# Patient Record
Sex: Male | Born: 1970
Health system: Southern US, Community
[De-identification: ages and names within clinical notes are randomized; demographics above are authoritative.]

## PROBLEM LIST (undated history)

## (undated) DIAGNOSIS — I10 Essential (primary) hypertension: Secondary | ICD-10-CM

## (undated) DIAGNOSIS — E7800 Pure hypercholesterolemia, unspecified: Secondary | ICD-10-CM

## (undated) DIAGNOSIS — E876 Hypokalemia: Secondary | ICD-10-CM

---

## 2003-08-14 ENCOUNTER — Emergency Department (HOSPITAL_COMMUNITY): Admission: EM | Admit: 2003-08-14 | Discharge: 2003-08-14 | Payer: Self-pay | Admitting: Emergency Medicine

## 2008-09-25 ENCOUNTER — Emergency Department (HOSPITAL_BASED_OUTPATIENT_CLINIC_OR_DEPARTMENT_OTHER): Admission: EM | Admit: 2008-09-25 | Discharge: 2008-09-25 | Payer: Self-pay | Admitting: Emergency Medicine

## 2009-04-16 ENCOUNTER — Ambulatory Visit: Payer: Self-pay | Admitting: Diagnostic Radiology

## 2009-04-16 ENCOUNTER — Emergency Department (HOSPITAL_BASED_OUTPATIENT_CLINIC_OR_DEPARTMENT_OTHER): Admission: EM | Admit: 2009-04-16 | Discharge: 2009-04-16 | Payer: Self-pay | Admitting: Emergency Medicine

## 2009-05-23 ENCOUNTER — Emergency Department (HOSPITAL_BASED_OUTPATIENT_CLINIC_OR_DEPARTMENT_OTHER): Admission: EM | Admit: 2009-05-23 | Discharge: 2009-05-23 | Payer: Self-pay | Admitting: Emergency Medicine

## 2010-06-03 LAB — POCT TOXICOLOGY PANEL

## 2010-06-03 LAB — BASIC METABOLIC PANEL
BUN: 10 mg/dL (ref 6–23)
CO2: 28 mEq/L (ref 19–32)
Calcium: 9 mg/dL (ref 8.4–10.5)
GFR calc non Af Amer: >60 mL/min (ref >60)

## 2010-06-03 LAB — POCT CARDIAC MARKERS
CKMB, poc: <1 ng/mL — ABNORMAL LOW (ref 1.0–8.0)
Myoglobin, poc: 61.9 ng/mL (ref 12–200)

## 2010-06-03 LAB — URINALYSIS, ROUTINE W REFLEX MICROSCOPIC: Hgb urine dipstick: NEGATIVE

## 2010-06-16 LAB — COMPREHENSIVE METABOLIC PANEL
AST: 54 U/L — ABNORMAL HIGH (ref 0–37)
BUN: 20 mg/dL (ref 6–23)
Chloride: 100 mEq/L (ref 96–112)
GFR calc Af Amer: >60 mL/min (ref >60)
Glucose, Bld: 153 mg/dL — ABNORMAL HIGH (ref 70–99)
Potassium: 3.3 mEq/L — ABNORMAL LOW (ref 3.5–5.1)

## 2010-06-16 LAB — DIFFERENTIAL
Eosinophils Relative: 0 % (ref 0–5)
Lymphs Abs: 1.9 10*3/uL (ref 0.7–4.0)
Monocytes Absolute: 0.8 10*3/uL (ref 0.1–1.0)
Monocytes Relative: 6 % (ref 3–12)
Neutro Abs: 9.7 10*3/uL — ABNORMAL HIGH (ref 1.7–7.7)
Neutrophils Relative %: 77 % (ref 43–77)

## 2010-06-16 LAB — URINALYSIS, ROUTINE W REFLEX MICROSCOPIC
Bilirubin Urine: NEGATIVE
Nitrite: NEGATIVE
Protein, ur: NEGATIVE mg/dL
Specific Gravity, Urine: 1.026 (ref 1.005–1.030)
pH: 5.5 (ref 5.0–8.0)

## 2010-06-16 LAB — CBC
Hemoglobin: 16.3 g/dL (ref 13.0–17.0)
Platelets: 209 10*3/uL (ref 150–400)
RDW: 11.8 % (ref 11.5–15.5)

## 2010-06-16 LAB — POCT CARDIAC MARKERS
CKMB, poc: 5.9 ng/mL (ref 1.0–8.0)
Troponin i, poc: <0.05 ng/mL (ref 0.00–0.09)

## 2010-06-16 LAB — GLUCOSE, CAPILLARY: Glucose-Capillary: 122 mg/dL — ABNORMAL HIGH (ref 70–99)

## 2010-10-13 ENCOUNTER — Emergency Department (HOSPITAL_BASED_OUTPATIENT_CLINIC_OR_DEPARTMENT_OTHER)
Admission: EM | Admit: 2010-10-13 | Discharge: 2010-10-14 | Disposition: A | Discharge status: Home / Self Care | Payer: Self-pay | Attending: Emergency Medicine | Admitting: Emergency Medicine

## 2010-10-13 ENCOUNTER — Other Ambulatory Visit: Payer: Self-pay

## 2010-10-13 ENCOUNTER — Emergency Department (INDEPENDENT_AMBULATORY_CARE_PROVIDER_SITE_OTHER): Payer: Self-pay

## 2010-10-13 ENCOUNTER — Encounter: Payer: Self-pay | Admitting: Emergency Medicine

## 2010-10-13 DIAGNOSIS — E876 Hypokalemia: Secondary | ICD-10-CM | POA: Insufficient documentation

## 2010-10-13 DIAGNOSIS — R42 Dizziness and giddiness: Secondary | ICD-10-CM

## 2010-10-13 DIAGNOSIS — I10 Essential (primary) hypertension: Secondary | ICD-10-CM | POA: Insufficient documentation

## 2010-10-13 DIAGNOSIS — R059 Cough, unspecified: Secondary | ICD-10-CM

## 2010-10-13 DIAGNOSIS — F17200 Nicotine dependence, unspecified, uncomplicated: Secondary | ICD-10-CM | POA: Insufficient documentation

## 2010-10-13 DIAGNOSIS — R05 Cough: Secondary | ICD-10-CM

## 2010-10-13 HISTORY — DX: Essential (primary) hypertension: I10

## 2010-10-13 LAB — BASIC METABOLIC PANEL
BUN: 11 mg/dL (ref 6–23)
CO2: 26 mEq/L (ref 19–32)
Calcium: 9.3 mg/dL (ref 8.4–10.5)
Chloride: 101 mEq/L (ref 96–112)
Creatinine, Ser: 0.8 mg/dL (ref 0.50–1.35)
Glucose, Bld: 113 mg/dL — ABNORMAL HIGH (ref 70–99)
Potassium: 3 mEq/L — ABNORMAL LOW (ref 3.5–5.1)
Sodium: 139 mEq/L (ref 135–145)

## 2010-10-13 LAB — CARDIAC PANEL(CRET KIN+CKTOT+MB+TROPI)
CK, MB: 5.1 ng/mL — ABNORMAL HIGH (ref 0.3–4.0)
Relative Index: 0.8 (ref 0.0–2.5)
Total CK: 646 U/L — ABNORMAL HIGH (ref 7–232)
Troponin I: <0.3 ng/mL

## 2010-10-13 LAB — CBC
MCH: 29.7 pg (ref 26.0–34.0)
MCV: 83.5 fL (ref 78.0–100.0)
RBC: 4.98 MIL/uL (ref 4.22–5.81)

## 2010-10-13 MED ORDER — POTASSIUM CHLORIDE CRYS ER 20 MEQ PO TBCR
40.0000 meq | EXTENDED_RELEASE_TABLET | Freq: Once | ORAL | Status: AC
  Administered 2010-10-14: 40 meq via ORAL
  Filled 2010-10-13: qty 2

## 2010-10-13 MED ORDER — SODIUM CHLORIDE 0.9 % IV BOLUS (SEPSIS)
1000.0000 mL | Freq: Once | INTRAVENOUS | Status: AC
  Administered 2010-10-14: 1000 mL via INTRAVENOUS

## 2010-10-13 NOTE — ED Provider Notes (Signed)
History     CSN: [id number] Arrival date & time: 10/13/2010  9:49 PM  Chief Complaint  Patient presents with  . Dizziness    Pt CO blurred vision and dizziness today x 24hrs reports "feel dehydrated   HPI Comments: Pt c/o dizziness and vision being hard to focus that started 2 days ago after working out it the heat for several hours.Pt states that he is not having pain  Anywhere but the symptoms area continuing:pt state that he has a history of potasium being low when this happens:pt states that he has been trying to drink gatorade and eat bananas without the symptoms resolving:pt states that he was in the shower tonight and the symptoms seemed worse and that is what made him come in tonight.Pt denies fever, n/v/d.  The history is provided by the patient. No language interpreter was used.    Past Medical History  Diagnosis Date  . Hypertension     History reviewed. No pertinent past surgical history.  History reviewed. No pertinent family history.  History  Substance Use Topics  . Smoking status: Current Some Day Smoker -- 1.0 packs/day  . Smokeless tobacco: Not on file  . Alcohol Use: No      Review of Systems  All other systems reviewed and are negative.    Physical Exam  BP 120/80  Pulse 80  Temp(Src) 99.1 F (37.3 C) (Oral)  Resp 22  SpO2 100%  Physical Exam  Constitutional: He is oriented to person, place, and time. He appears well-developed and well-nourished.  HENT:  Head: Normocephalic.  Eyes: Pupils are equal, round, and reactive to light.  Neck: Normal range of motion.  Cardiovascular: Normal rate and regular rhythm.   Pulmonary/Chest: Effort normal and breath sounds normal.  Abdominal: Soft. Bowel sounds are normal.  Musculoskeletal: Normal range of motion.  Neurological: He is alert and oriented to person, place, and time. Coordination normal.  Skin: Skin is warm and dry.  Psychiatric: He has a normal mood and affect.    ED Course   Procedures Results for orders placed during the hospital encounter of 10/13/10  CBC      Component Value Range   WBC 4.9  4.0 - 10.5 (K/uL)   RBC 4.98  4.22 - 5.81 (MIL/uL)   Hemoglobin 14.8  13.0 - 17.0 (g/dL)   HCT 30.8  65.7 - 84.6 (%)   MCV 83.5  78.0 - 100.0 (fL)   MCH 29.7  26.0 - 34.0 (pg)   MCHC 35.6  30.0 - 36.0 (g/dL)   RDW 96.2  95.2 - 84.1 (%)   Platelets 170  150 - 400 (K/uL)  BASIC METABOLIC PANEL      Component Value Range   Sodium 139  135 - 145 (mEq/L)   Potassium 3.0 (*) 3.5 - 5.1 (mEq/L)   Chloride 101  96 - 112 (mEq/L)   CO2 26  19 - 32 (mEq/L)   Glucose, Bld 113 (*) 70 - 99 (mg/dL)   BUN 11  6 - 23 (mg/dL)   Creatinine, Ser 3.24  0.50 - 1.35 (mg/dL)   Calcium 9.3  8.4 - 40.1 (mg/dL)   GFR calc non Af Amer >60  >60 (mL/min)   GFR calc Af Amer >60  >60 (mL/min)  CARDIAC PANEL(CRET KIN+CKTOT+MB+TROPI)      Component Value Range   Total CK 646 (*) 7 - 232 (U/L)   CK, MB 5.1 (*) 0.3 - 4.0 (ng/mL)   Troponin I <0.30  <0.30 (  ng/mL)   Relative Index 0.8  0.0 - 2.5    Dg Chest 2 View  10/13/2010  *RADIOLOGY REPORT*  Clinical Data: Dizziness and cough.  CHEST - 2 VIEW  Comparison: Chest x-ray 08/14/2003.  Findings: The cardiac silhouette, mediastinal and hilar contours are within normal limits and stable.  The lungs are clear.  The bony thorax is intact.  IMPRESSION: No acute cardiopulmonary findings.  Original Report Authenticated By: P. Loralie Champagne, M.D.    Date: 10/13/2010  Rate: 66  Rhythm: normal sinus rhythm  QRS Axis: normal  Intervals: normal  ST/T Wave abnormalities: normal  Conduction Disutrbances:first-degree A-V block   Narrative Interpretation:   Old EKG Reviewed: unchanged 12:02 AM Pt still slightly symptomatic:will give pt some more fluids MDM Likely related to dehydration:pt given hydration and is feeling better at this time      Teressa Lower, NP 10/14/10 0050

## 2010-10-13 NOTE — ED Notes (Signed)
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

## 2010-10-14 MED ORDER — SODIUM CHLORIDE 0.9 % IV BOLUS (SEPSIS)
1000.0000 mL | Freq: Once | INTRAVENOUS | Status: AC
Start: 2010-10-14 — End: 2010-10-14
  Administered 2010-10-14: 1000 mL via INTRAVENOUS

## 2010-10-17 NOTE — ED Provider Notes (Signed)
Medical screening examination/treatment/procedure(s) were performed by non-physician practitioner and as supervising physician I was immediately available for consultation/collaboration.  Juliet Rude. Rubin Payor, MD 10/17/10 [id number]

## 2011-04-21 ENCOUNTER — Encounter (HOSPITAL_BASED_OUTPATIENT_CLINIC_OR_DEPARTMENT_OTHER): Payer: Self-pay | Admitting: Family Medicine

## 2011-04-21 ENCOUNTER — Emergency Department (HOSPITAL_BASED_OUTPATIENT_CLINIC_OR_DEPARTMENT_OTHER)
Admission: EM | Admit: 2011-04-21 | Discharge: 2011-04-21 | Disposition: A | Discharge status: Home / Self Care | Payer: Self-pay | Attending: Emergency Medicine | Admitting: Emergency Medicine

## 2011-04-21 DIAGNOSIS — E7800 Pure hypercholesterolemia, unspecified: Secondary | ICD-10-CM | POA: Insufficient documentation

## 2011-04-21 DIAGNOSIS — B349 Viral infection, unspecified: Secondary | ICD-10-CM

## 2011-04-21 DIAGNOSIS — IMO0001 Reserved for inherently not codable concepts without codable children: Secondary | ICD-10-CM | POA: Insufficient documentation

## 2011-04-21 DIAGNOSIS — Z79899 Other long term (current) drug therapy: Secondary | ICD-10-CM | POA: Insufficient documentation

## 2011-04-21 DIAGNOSIS — B9789 Other viral agents as the cause of diseases classified elsewhere: Secondary | ICD-10-CM | POA: Insufficient documentation

## 2011-04-21 DIAGNOSIS — F17200 Nicotine dependence, unspecified, uncomplicated: Secondary | ICD-10-CM | POA: Insufficient documentation

## 2011-04-21 DIAGNOSIS — J3489 Other specified disorders of nose and nasal sinuses: Secondary | ICD-10-CM | POA: Insufficient documentation

## 2011-04-21 DIAGNOSIS — I10 Essential (primary) hypertension: Secondary | ICD-10-CM | POA: Insufficient documentation

## 2011-04-21 HISTORY — DX: Pure hypercholesterolemia, unspecified: E78.00

## 2011-04-21 LAB — URINALYSIS, ROUTINE W REFLEX MICROSCOPIC
Glucose, UA: NEGATIVE mg/dL
Hgb urine dipstick: NEGATIVE
Leukocytes, UA: NEGATIVE
Specific Gravity, Urine: 1.034 — ABNORMAL HIGH (ref 1.005–1.030)
pH: 5.5 (ref 5.0–8.0)

## 2011-04-21 MED ORDER — OSELTAMIVIR PHOSPHATE 75 MG PO CAPS: 75.0000 mg | ORAL_CAPSULE | Freq: Two times a day (BID) | ORAL | Status: AC

## 2011-04-21 NOTE — ED Provider Notes (Signed)
Medical screening examination/treatment/procedure(s) were performed by non-physician practitioner and as supervising physician I was immediately available for consultation/collaboration.   Hurman Horn, MD 04/21/11 [phone]

## 2011-04-21 NOTE — ED Notes (Signed)
Pt c/o congestion, back ache and "itchy" throat. Pt sts he took tylenol today.

## 2011-04-21 NOTE — ED Provider Notes (Signed)
History     CSN: [id number]  Arrival date & time 04/21/11  1736   First MD Initiated Contact with Patient 04/21/11 1825      Chief Complaint  Patient presents with  . Generalized Body Aches  . Nasal Congestion    (Consider location/radiation/quality/duration/timing/severity/associated sxs/prior treatment) HPI Comments: Pt state that he started with myalgias, sore throat and nasal congestion 2 days ago:pt states that he has been running fever intermittently:pt state that he feels like he has something in his throat  The history is provided by the patient. No language interpreter was used.    Past Medical History  Diagnosis Date  . Hypertension   . High cholesterol     History reviewed. No pertinent past surgical history.  No family history on file.  History  Substance Use Topics  . Smoking status: Current Some Day Smoker -- 1.0 packs/day    Types: Cigars  . Smokeless tobacco: Not on file  . Alcohol Use: No      Review of Systems  All other systems reviewed and are negative.    Allergies  Review of patient's allergies indicates no known allergies.  Home Medications   Current Outpatient Rx  Name Route Sig Dispense Refill  . ACETAMINOPHEN 500 MG PO TABS Oral Take 1,000 mg by mouth once.    Marland Kitchen CALCIUM GUMMIES PO Oral Take 3 tablets by mouth daily.      Marland Kitchen DM-PHENYLEPHRINE-ACETAMINOPHEN 10-5-325 MG/15ML PO LIQD Oral Take 30 mLs by mouth once.      . IBUPROFEN 200 MG PO TABS Oral Take 400 mg by mouth as needed. Pain     . OLMESARTAN MEDOXOMIL-HCTZ 40-25 MG PO TABS Oral Take 1 tablet by mouth daily.      Marland Kitchen PSEUDOEPHEDRINE-APAP-DM 60-1000-30 MG/30ML PO LIQD Oral Take 30 mLs by mouth every 4 (four) hours as needed. For congestion    . CRESTOR PO Oral Take 1 tablet by mouth daily.        BP 158/109  Pulse 67  Temp(Src) 98.2 F (36.8 C) (Oral)  Resp 18  Ht 6' (1.829 m)  Wt 225 lb (102.059 kg)  BMI 30.52 kg/m2  SpO2 100%  Physical Exam  Nursing note and  vitals reviewed. Constitutional: He is oriented to person, place, and time. He appears well-developed and well-nourished.  HENT:  Head: Normocephalic and atraumatic.  Right Ear: External ear normal.  Left Ear: External ear normal.  Mouth/Throat: Posterior oropharyngeal erythema present.  Eyes: EOM are normal.  Neck: Neck supple.  Cardiovascular: Normal rate and regular rhythm.   Pulmonary/Chest: Effort normal and breath sounds normal.  Abdominal: Soft. Bowel sounds are normal.  Musculoskeletal: Normal range of motion.  Neurological: He is alert and oriented to person, place, and time.  Skin: Skin is warm.    ED Course  Procedures (including critical care time)  Labs Reviewed  URINALYSIS, ROUTINE W REFLEX MICROSCOPIC - Abnormal; Notable for the following:    Specific Gravity, Urine 1.034 (*)    All other components within normal limits  RAPID STREP SCREEN   No results found.   1. Viral illness       MDM  Pt states that he thinks he was exposed to the flu and he feels like he needs to be treated:discussed risk benefit with pt       Teressa Lower, NP 04/21/11 2234

## 2011-08-07 IMAGING — CT CT PARANASAL SINUSES LIMITED
1 series · 16 of 30 positions shown, 20 images · non-contrast
Comparison: None.
COMPARISON: None.

CLINICAL DATA: sinus infection.

CT LIMITED SINUSES WITHOUT CONTRAST
TECHNIQUE: Multidetector CT images of the paranasal sinuses were
obtained in a single plane without contrast.
TECHNIQUE: Contiguous axial images were obtained from the base of
the skull through the vertex without intravenous contrast.

[Series 2: head 4.8 h37s · axial · 0.48mm/px · z∈[+1246,+1398]mm · 16 of 36 slices shown, 20 images]
[im 2/36  brain]
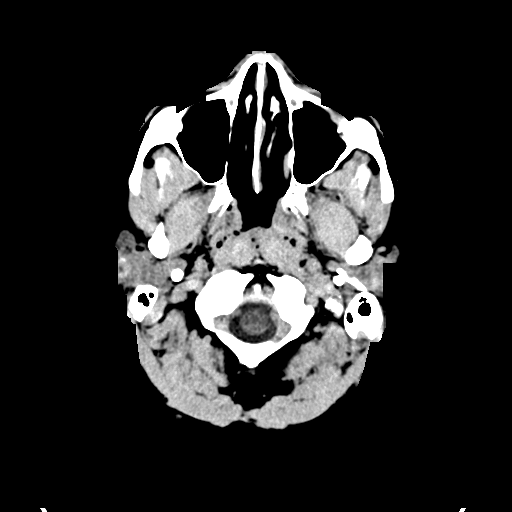
[im 2/36  bone]
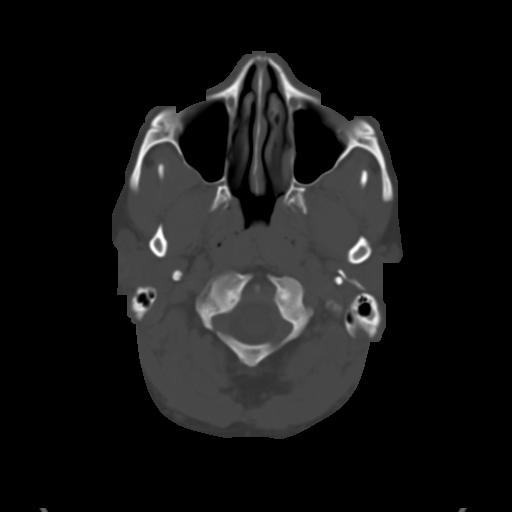
[im 4/36  bone]
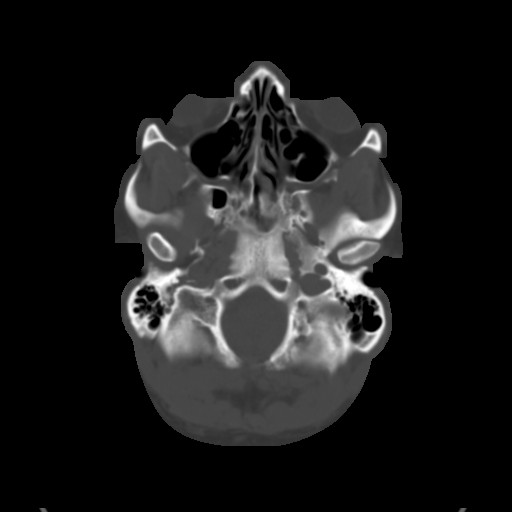
[im 7/36  bone]
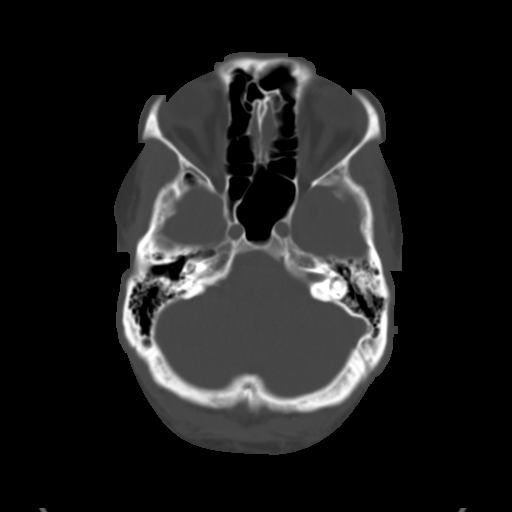
[im 9/36  bone]
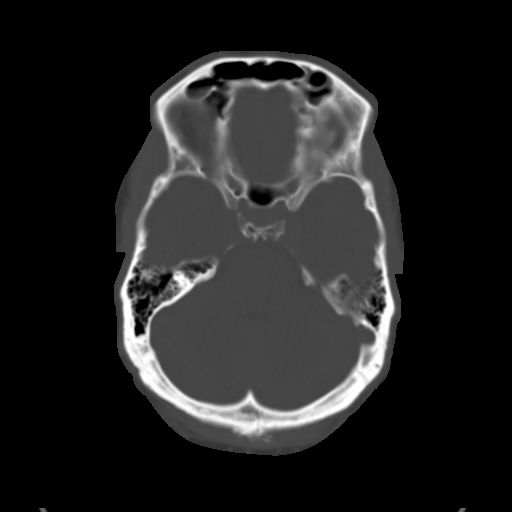
[im 10/36  brain]
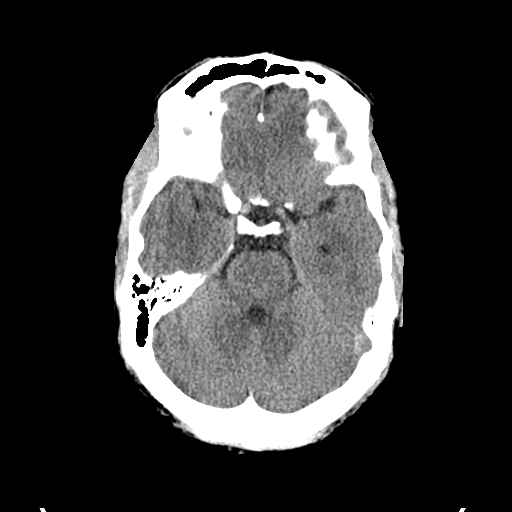
[im 10/36  bone]
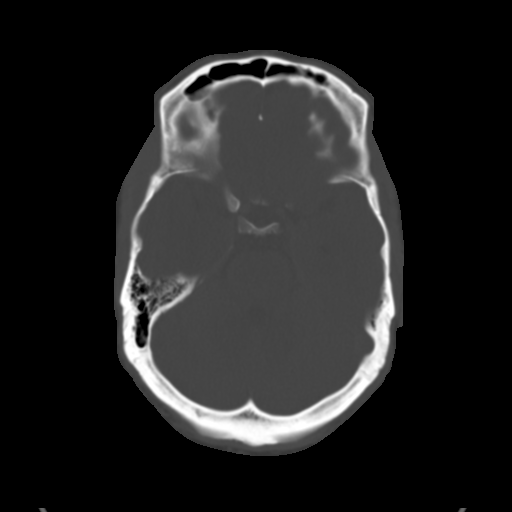
[im 13/36  bone]
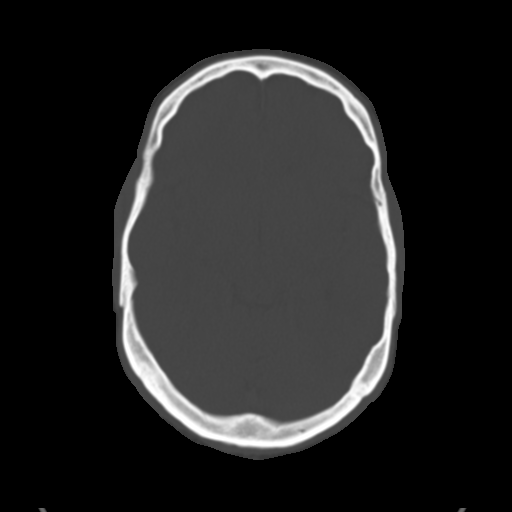
[im 15/36  bone]
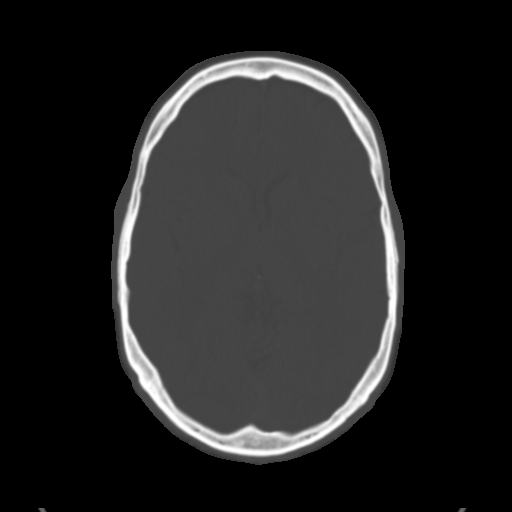
[im 17/36  bone]
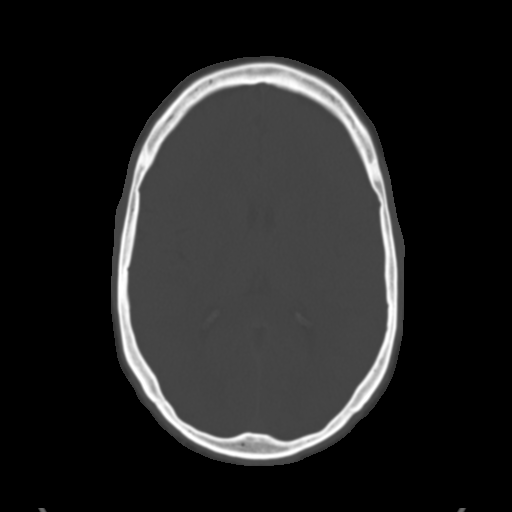
[im 19/36  brain]
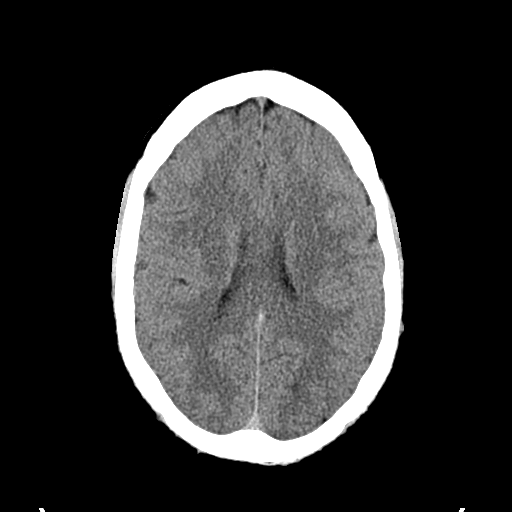
[im 19/36  bone]
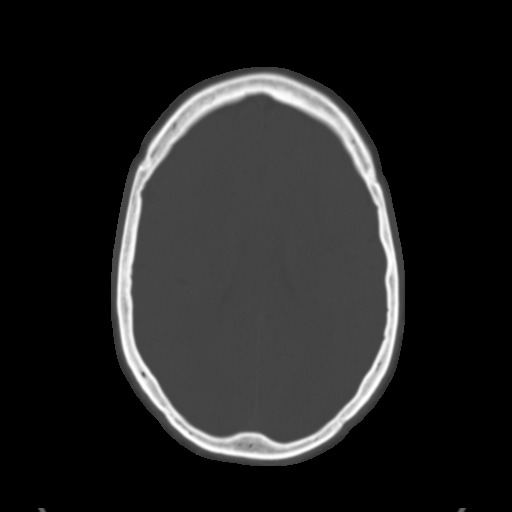
[im 21/36  bone]
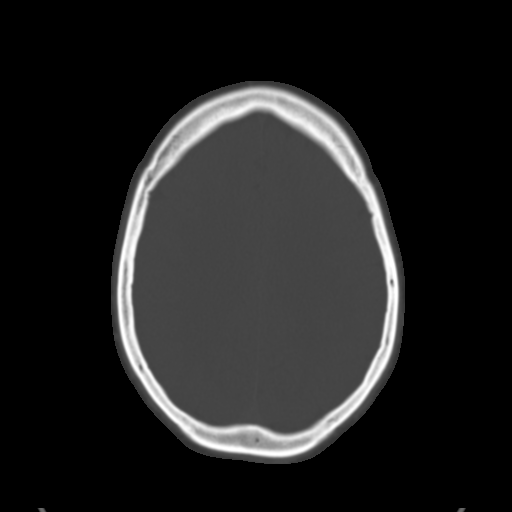
[im 23/36  bone]
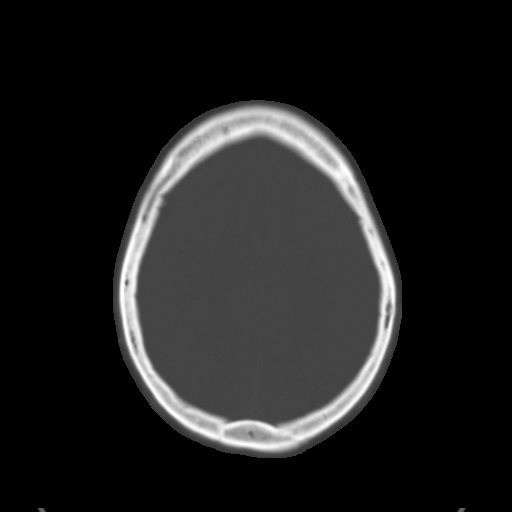
[im 26/36  bone]
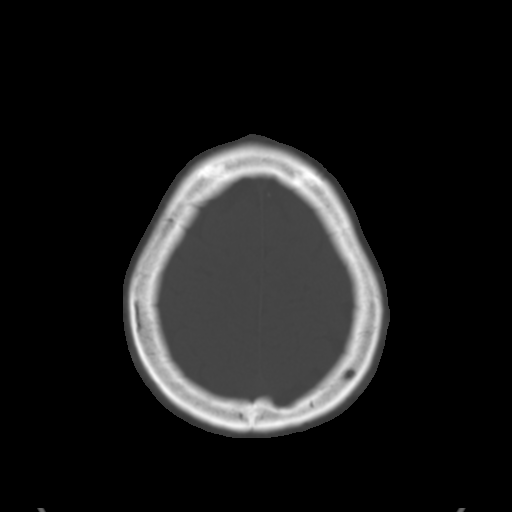
[im 27/36  brain]
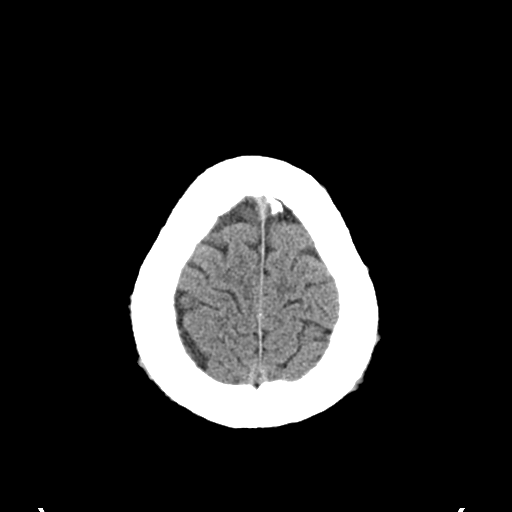
[im 27/36  bone]
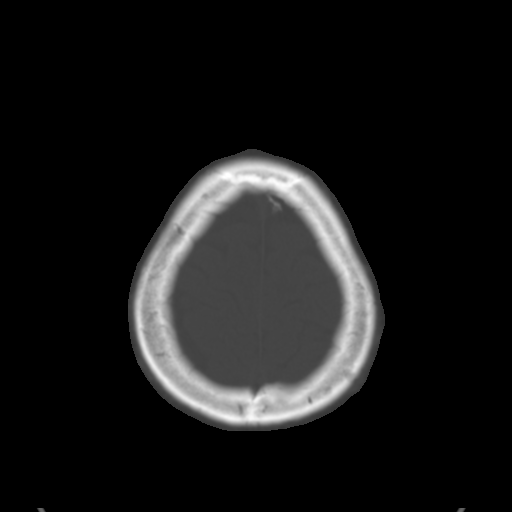
[im 29/36  bone]
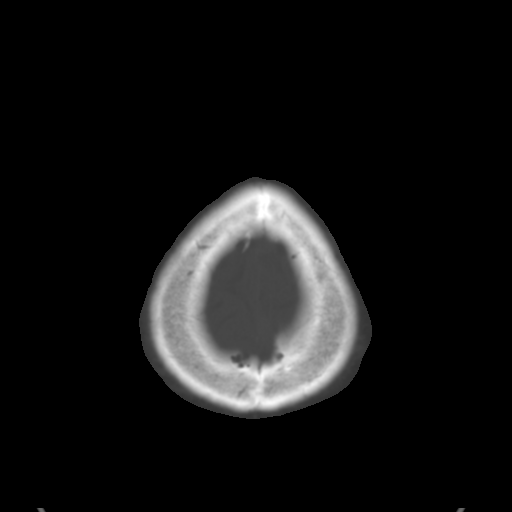
[im 32/36  bone]
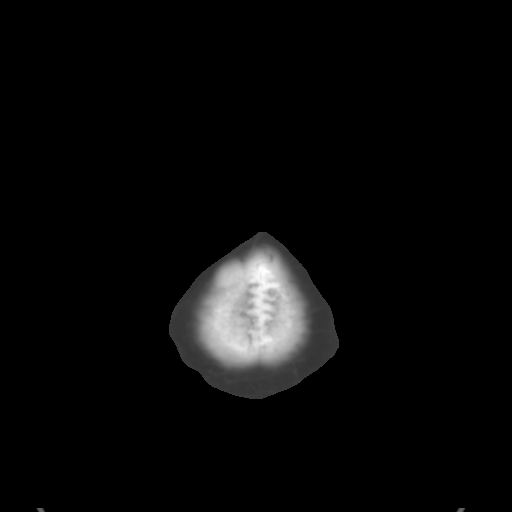
[im 34/36  bone]
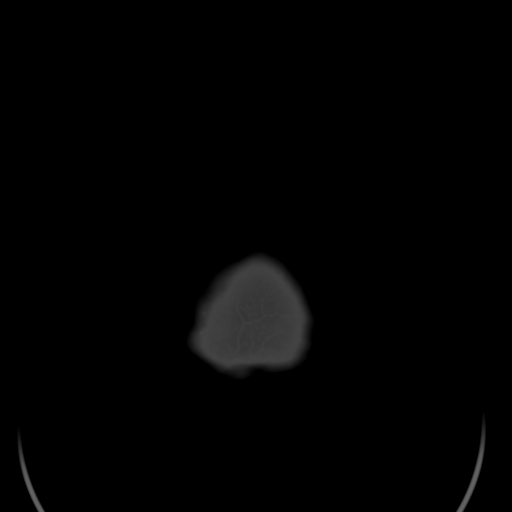

[16 of 30 positions shown; findings below may reference images not displayed]

FINDINGS: Limited evaluation of the sinuses were performed.  The
paranasal sinuses are well aerated and developed.  There is no
mucosal thickening.  Negative for air fluid level or retention
cyst.  There is no fracture or bony mass lesion.
IMPRESSION: Normal limited CT of the paranasal sinuses.

CT HEAD WITHOUT CONTRAST
FINDINGS: The brain has a normal appearance without evidence for
hemorrhage, infarction, hydrocephalus, or mass lesion.  There is no
extra axial fluid collection.  The skull and paranasal sinuses are
normal.
IMPRESSION: No acute intracranial abnormalities.

## 2013-04-03 ENCOUNTER — Emergency Department (HOSPITAL_BASED_OUTPATIENT_CLINIC_OR_DEPARTMENT_OTHER): Payer: Self-pay

## 2013-04-03 ENCOUNTER — Emergency Department (HOSPITAL_BASED_OUTPATIENT_CLINIC_OR_DEPARTMENT_OTHER)
Admission: EM | Admit: 2013-04-03 | Discharge: 2013-04-03 | Disposition: A | Discharge status: Home / Self Care | Payer: Self-pay | Attending: Emergency Medicine | Admitting: Emergency Medicine

## 2013-04-03 ENCOUNTER — Encounter (HOSPITAL_BASED_OUTPATIENT_CLINIC_OR_DEPARTMENT_OTHER): Payer: Self-pay | Admitting: Emergency Medicine

## 2013-04-03 DIAGNOSIS — R42 Dizziness and giddiness: Secondary | ICD-10-CM | POA: Insufficient documentation

## 2013-04-03 DIAGNOSIS — S060X9A Concussion with loss of consciousness of unspecified duration, initial encounter: Secondary | ICD-10-CM

## 2013-04-03 DIAGNOSIS — S060X0A Concussion without loss of consciousness, initial encounter: Secondary | ICD-10-CM | POA: Insufficient documentation

## 2013-04-03 DIAGNOSIS — Z23 Encounter for immunization: Secondary | ICD-10-CM | POA: Insufficient documentation

## 2013-04-03 DIAGNOSIS — R1110 Vomiting, unspecified: Secondary | ICD-10-CM | POA: Insufficient documentation

## 2013-04-03 DIAGNOSIS — S0181XA Laceration without foreign body of other part of head, initial encounter: Secondary | ICD-10-CM

## 2013-04-03 DIAGNOSIS — E7800 Pure hypercholesterolemia, unspecified: Secondary | ICD-10-CM | POA: Insufficient documentation

## 2013-04-03 DIAGNOSIS — S01501A Unspecified open wound of lip, initial encounter: Secondary | ICD-10-CM | POA: Insufficient documentation

## 2013-04-03 DIAGNOSIS — R5381 Other malaise: Secondary | ICD-10-CM | POA: Insufficient documentation

## 2013-04-03 DIAGNOSIS — R5383 Other fatigue: Secondary | ICD-10-CM

## 2013-04-03 DIAGNOSIS — S0510XA Contusion of eyeball and orbital tissues, unspecified eye, initial encounter: Secondary | ICD-10-CM | POA: Insufficient documentation

## 2013-04-03 DIAGNOSIS — I10 Essential (primary) hypertension: Secondary | ICD-10-CM | POA: Insufficient documentation

## 2013-04-03 DIAGNOSIS — S060XAA Concussion with loss of consciousness status unknown, initial encounter: Secondary | ICD-10-CM

## 2013-04-03 DIAGNOSIS — F17200 Nicotine dependence, unspecified, uncomplicated: Secondary | ICD-10-CM | POA: Insufficient documentation

## 2013-04-03 DIAGNOSIS — S0990XA Unspecified injury of head, initial encounter: Secondary | ICD-10-CM

## 2013-04-03 DIAGNOSIS — Z79899 Other long term (current) drug therapy: Secondary | ICD-10-CM | POA: Insufficient documentation

## 2013-04-03 LAB — CBC WITH DIFFERENTIAL/PLATELET
BASOS ABS: 0 10*3/uL (ref 0.0–0.1)
Basophils Relative: 0 % (ref 0–1)
EOS PCT: 0 % (ref 0–5)
Eosinophils Absolute: 0 10*3/uL (ref 0.0–0.7)
HEMATOCRIT: 44.4 % (ref 39.0–52.0)
HEMOGLOBIN: 15.6 g/dL (ref 13.0–17.0)
Lymphocytes Relative: 17 % (ref 12–46)
Lymphs Abs: 2.4 10*3/uL (ref 0.7–4.0)
MCH: 29.8 pg (ref 26.0–34.0)
MCHC: 35.1 g/dL (ref 30.0–36.0)
MCV: 84.7 fL (ref 78.0–100.0)
MONO ABS: 1.1 10*3/uL — AB (ref 0.1–1.0)
MONOS PCT: 8 % (ref 3–12)
NEUTROS ABS: 10.5 10*3/uL — AB (ref 1.7–7.7)
Neutrophils Relative %: 75 % (ref 43–77)
Platelets: 272 10*3/uL (ref 150–400)
RBC: 5.24 MIL/uL (ref 4.22–5.81)
RDW: 11.9 % (ref 11.5–15.5)
WBC: 14 10*3/uL — ABNORMAL HIGH (ref 4.0–10.5)

## 2013-04-03 LAB — BASIC METABOLIC PANEL
BUN: 16 mg/dL (ref 6–23)
CALCIUM: 10.7 mg/dL — AB (ref 8.4–10.5)
CHLORIDE: 99 meq/L (ref 96–112)
CO2: 25 mEq/L (ref 19–32)
CREATININE: 1.2 mg/dL (ref 0.50–1.35)
GFR calc Af Amer: 84 mL/min — ABNORMAL LOW (ref >90)
GFR calc non Af Amer: 73 mL/min — ABNORMAL LOW (ref >90)
Glucose, Bld: 119 mg/dL — ABNORMAL HIGH (ref 70–99)
Potassium: 3.6 mEq/L — ABNORMAL LOW (ref 3.7–5.3)
Sodium: 142 mEq/L (ref 137–147)

## 2013-04-03 MED ORDER — NAPROXEN 500 MG PO TABS: 500.0000 mg | ORAL_TABLET | Freq: Two times a day (BID) | ORAL | Status: AC

## 2013-04-03 MED ORDER — HYDROMORPHONE HCL PF 1 MG/ML IJ SOLN
0.5000 mg | Freq: Once | INTRAMUSCULAR | Status: AC
  Administered 2013-04-03: 0.5 mg via INTRAVENOUS
  Filled 2013-04-03: qty 1

## 2013-04-03 MED ORDER — PROMETHAZINE HCL 25 MG PO TABS: 25.0000 mg | ORAL_TABLET | Freq: Four times a day (QID) | ORAL | Status: AC | PRN

## 2013-04-03 MED ORDER — ONDANSETRON HCL 4 MG/2ML IJ SOLN
4.0000 mg | Freq: Once | INTRAMUSCULAR | Status: AC
  Administered 2013-04-03: 4 mg via INTRAVENOUS
  Filled 2013-04-03: qty 2

## 2013-04-03 MED ORDER — SODIUM CHLORIDE 0.9 % IV SOLN
INTRAVENOUS | Status: DC
  Administered 2013-04-03: 20:00:00 via INTRAVENOUS

## 2013-04-03 MED ORDER — TETANUS-DIPHTH-ACELL PERTUSSIS 5-2.5-18.5 LF-MCG/0.5 IM SUSP
0.5000 mL | Freq: Once | INTRAMUSCULAR | Status: AC
  Administered 2013-04-03: 0.5 mL via INTRAMUSCULAR
  Filled 2013-04-03: qty 0.5

## 2013-04-03 MED ORDER — HYDROCODONE-ACETAMINOPHEN 5-325 MG PO TABS: 1.0000 | ORAL_TABLET | Freq: Four times a day (QID) | ORAL | Status: AC | PRN

## 2013-04-03 MED ORDER — SODIUM CHLORIDE 0.9 % IV BOLUS (SEPSIS)
250.0000 mL | Freq: Once | INTRAVENOUS | Status: AC
  Administered 2013-04-03: 250 mL via INTRAVENOUS

## 2013-04-03 NOTE — Discharge Instructions (Signed)
Concussion, Adult A concussion is a brain injury. It is caused by:  A hit to the head.  A quick and sudden movement (jolt) of the head or neck. A concussion is usually not life-threatening. Even so, it can cause serious problems. If you had a concussion before, you may have concussion-like problems after a hit to your head. HOME CARE General Instructions  Follow your doctor's directions carefully.  Take medicines only as told by your doctor.  Only take medicines your doctor says are safe.  Do not drink alcohol until your doctor says it is OK. Alcohol and some drugs can slow down healing. They can also put you at risk for further injury.  If your are having trouble remembering things, write them down.  Try to do one thing at a time if you get distracted easily. For example, do not watch TV while making dinner.  Talk to your family members or close friends when making important decisions.  Follow up with your doctor as told.  Watch your symptoms. Tell others to do the same. Serious problems can sometimes happen after a concussion. Older adults are more likely to have these problems.  Tell your teachers, school nurse, school counselor, coach, Product/process development scientist, or work Freight forwarder about your concussion. Tell them about what you can or cannot do. They should watch to see if:  It gets even harder for you to pay attention or concentrate.  It gets even harder for you to remember things or learn new things.  You need more time than normal to finish things.  You become annoyed (irritable) more than before.  You are not able to deal with stress as well.  You have more problems than before.  Rest. Make sure you:  Get plenty of sleep at night.  Go to sleep early.  Go to bed at the same time every day. Try to wake up at the same time.  Rest during the day.  Take naps when you feel tired.  Limit activities where you have to think a lot or concentrate. These include:  Doing  homework.  Doing work related to a job.  Watching TV.  Using the computer. Returning To Your Regular Activities Return to your normal activities slowly, not all at once. You must give your body and brain enough time to heal.   Do not play sports or do other athletic activities until your doctor says it is OK.  Ask your doctor when you can drive, ride a bicycle, or work other vehicles or machines. Never do these things if you feel dizzy.  Ask your doctor about when you can return to work or school. Preventing Another Concussion It is very important to avoid another brain injury, especially before you have healed. In rare cases, another injury can lead to permanent brain damage, brain swelling, or death. The risk of this is greatest during the first 7 10 after your injury. Avoid injuries by:   Wearing a seat belt when riding in a car.  Not drinking too much alcohol.  Avoiding activities that could lead to a second concussion (such as contact sports).  Wearing a helmet when doing activities like:  Biking.  Skiing.  Skateboarding.  Skating.  Making your home safer by:  Removing things from the floor or stairways that could make you trip.  Using grab bars in bathrooms and handrails by stairs.  Placing non-slip mats on floors and in bathtubs.  Improve lighting in dark areas. GET HELP IF:  It  gets even harder for you to pay attention or concentrate.  It gets even harder for you to remember things or learn new things.  You need more time than normal to finish things.  You become annoyed (irritable) more than before.  You are not able to deal with stress as well.  You have more problems than before.  You have problems keeping your balance.  You are not able to react quickly when you should. Get help if you have any of these problems for more than 2 weeks:   Lasting (chronic) headaches.  Dizziness or trouble balancing.  Feeling sick to your stomach  (nausea).  Seeing (vision) problems.  Being affected by noises or light more than normal.  Feeling sad, low, down in the dumps, blue, gloomy, or empty (depressed).  Mood changes (mood swings).  Feeling of fear or nervousness about what may happen (anxiety).  Feeling annoyed.  Memory problems.  Problems concentrating or paying attention.  Sleep problems.  Feeling tired all the time. GET HELP RIGHT AWAY IF:   You have bad headaches or your headaches get worse.  You have weakness (even if it is in one hand, leg, or part of the face).  You have loss of feeling (numbness).  You feel off balance.  You keep throwing up (vomiting).  You feel tired.  One black center of your eye (pupil) is larger than the other.  You twitch or shake violently (convulse).  Your speech is not clear (slurred).  You are more confused, easily angered (agitated), or annoyed than before.  You have more trouble resting than before.  You are unable to recognize people or places.  You have neck pain.  It is difficult to wake you up.  You have unusual behavior changes.  You pass out (lose consciousness). MAKE SURE YOU:   Understand these instructions.  Will watch your condition.  Will get help right away if you are not doing well or get worse. Document Released: 02/12/2009 Document Revised: 12/15/2012 Document Reviewed: 09/16/2012 [facility] LtdExit[facility] Patient Information 2014 [employer], [place].  CT scan of head neck and face without any significant abnormalities. But based on your symptoms you do have mild concussion. Take antinausea medicine as needed. Take anti-inflammatory medicine for the soreness and pain medicine as needed for pain. Work note provided for the next few days. Normally a concussion will take about a week to completely heal. If you're having problems beyond that to return to be seen. Wound care distress with the Polysporin Neosporin ointment arch with soap and water daily and apply  the ointment twice a day. This wound should heal well over the next 7 days.
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
## 2014-05-16 ENCOUNTER — Encounter (HOSPITAL_BASED_OUTPATIENT_CLINIC_OR_DEPARTMENT_OTHER): Payer: Self-pay

## 2014-05-16 ENCOUNTER — Emergency Department (HOSPITAL_BASED_OUTPATIENT_CLINIC_OR_DEPARTMENT_OTHER)
Admission: EM | Admit: 2014-05-16 | Discharge: 2014-05-16 | Discharge status: Against Medical Advice | Payer: No Typology Code available for payment source | Attending: Emergency Medicine | Admitting: Emergency Medicine

## 2014-05-16 DIAGNOSIS — Z720 Tobacco use: Secondary | ICD-10-CM | POA: Diagnosis not present

## 2014-05-16 DIAGNOSIS — I10 Essential (primary) hypertension: Secondary | ICD-10-CM | POA: Insufficient documentation

## 2014-05-16 DIAGNOSIS — J111 Influenza due to unidentified influenza virus with other respiratory manifestations: Secondary | ICD-10-CM | POA: Diagnosis present

## 2014-05-16 MED ORDER — IBUPROFEN 800 MG PO TABS
800.0000 mg | ORAL_TABLET | Freq: Once | ORAL | Status: AC
  Administered 2014-05-16: 800 mg via ORAL
  Filled 2014-05-16: qty 1

## 2014-05-16 NOTE — ED Notes (Signed)
According to registration clerk patient left.

## 2014-05-16 NOTE — ED Notes (Addendum)
Pt states he was dx with "the flu" today and started on tamiflu by [facility]-then states he was swabbed/advsied he did not have the flu but was started on tamiflu-has taken one-c/o vomiting and pain all over

## 2014-05-16 NOTE — ED Notes (Signed)
Patient called for triage x2, no answer °

## 2014-05-16 NOTE — ED Notes (Signed)
Pt states he signed in earlier today and left prior to triage

## 2014-05-19 ENCOUNTER — Emergency Department (HOSPITAL_BASED_OUTPATIENT_CLINIC_OR_DEPARTMENT_OTHER)
Admission: EM | Admit: 2014-05-19 | Discharge: 2014-05-19 | Discharge status: Absent without leave | Payer: No Typology Code available for payment source

## 2014-05-19 ENCOUNTER — Emergency Department (HOSPITAL_BASED_OUTPATIENT_CLINIC_OR_DEPARTMENT_OTHER)
Admission: EM | Admit: 2014-05-19 | Discharge: 2014-05-19 | Disposition: A | Discharge status: Home / Self Care | Payer: No Typology Code available for payment source | Attending: Emergency Medicine | Admitting: Emergency Medicine

## 2014-05-19 ENCOUNTER — Encounter (HOSPITAL_BASED_OUTPATIENT_CLINIC_OR_DEPARTMENT_OTHER): Payer: Self-pay | Admitting: *Deleted

## 2014-05-19 DIAGNOSIS — M791 Myalgia: Secondary | ICD-10-CM | POA: Diagnosis not present

## 2014-05-19 DIAGNOSIS — Z791 Long term (current) use of non-steroidal anti-inflammatories (NSAID): Secondary | ICD-10-CM | POA: Diagnosis not present

## 2014-05-19 DIAGNOSIS — E780 Pure hypercholesterolemia: Secondary | ICD-10-CM | POA: Diagnosis not present

## 2014-05-19 DIAGNOSIS — Z720 Tobacco use: Secondary | ICD-10-CM | POA: Insufficient documentation

## 2014-05-19 DIAGNOSIS — E860 Dehydration: Secondary | ICD-10-CM | POA: Insufficient documentation

## 2014-05-19 DIAGNOSIS — Z79899 Other long term (current) drug therapy: Secondary | ICD-10-CM | POA: Diagnosis not present

## 2014-05-19 DIAGNOSIS — I10 Essential (primary) hypertension: Secondary | ICD-10-CM | POA: Insufficient documentation

## 2014-05-19 DIAGNOSIS — E876 Hypokalemia: Secondary | ICD-10-CM | POA: Insufficient documentation

## 2014-05-19 DIAGNOSIS — R05 Cough: Secondary | ICD-10-CM | POA: Diagnosis present

## 2014-05-19 LAB — BASIC METABOLIC PANEL
ANION GAP: 12 (ref 5–15)
BUN: 15 mg/dL (ref 6–23)
CHLORIDE: 97 mmol/L (ref 96–112)
CO2: 26 mmol/L (ref 19–32)
CREATININE: 0.91 mg/dL (ref 0.50–1.35)
Calcium: 9.4 mg/dL (ref 8.4–10.5)
GFR calc Af Amer: >90 mL/min (ref >90)
GFR calc non Af Amer: >90 mL/min (ref >90)
Glucose, Bld: 202 mg/dL — ABNORMAL HIGH (ref 70–99)
POTASSIUM: 2.8 mmol/L — AB (ref 3.5–5.1)
Sodium: 135 mmol/L (ref 135–145)

## 2014-05-19 LAB — CBC WITH DIFFERENTIAL/PLATELET
Basophils Absolute: 0 10*3/uL (ref 0.0–0.1)
Basophils Relative: 0 % (ref 0–1)
Eosinophils Absolute: 0 10*3/uL (ref 0.0–0.7)
Eosinophils Relative: 0 % (ref 0–5)
HCT: 42.4 % (ref 39.0–52.0)
Hemoglobin: 14.9 g/dL (ref 13.0–17.0)
LYMPHS ABS: 1.2 10*3/uL (ref 0.7–4.0)
Lymphocytes Relative: 24 % (ref 12–46)
MCH: 29.8 pg (ref 26.0–34.0)
MCHC: 35.1 g/dL (ref 30.0–36.0)
MCV: 84.8 fL (ref 78.0–100.0)
Monocytes Absolute: 0.4 10*3/uL (ref 0.1–1.0)
Monocytes Relative: 7 % (ref 3–12)
NEUTROS PCT: 69 % (ref 43–77)
Neutro Abs: 3.6 10*3/uL (ref 1.7–7.7)
PLATELETS: 227 10*3/uL (ref 150–400)
RBC: 5 MIL/uL (ref 4.22–5.81)
RDW: 12.1 % (ref 11.5–15.5)
WBC: 5.1 10*3/uL (ref 4.0–10.5)

## 2014-05-19 LAB — CK: CK TOTAL: 227 U/L (ref 7–232)

## 2014-05-19 MED ORDER — SODIUM CHLORIDE 0.9 % IV BOLUS (SEPSIS)
1000.0000 mL | Freq: Once | INTRAVENOUS | Status: AC
  Administered 2014-05-19: 1000 mL via INTRAVENOUS

## 2014-05-19 MED ORDER — POTASSIUM CHLORIDE 10 MEQ/100ML IV SOLN
10.0000 meq | Freq: Once | INTRAVENOUS | Status: AC
  Administered 2014-05-19: 10 meq via INTRAVENOUS
  Filled 2014-05-19: qty 100

## 2014-05-19 MED ORDER — POTASSIUM CHLORIDE CRYS ER 20 MEQ PO TBCR
20.0000 meq | EXTENDED_RELEASE_TABLET | Freq: Once | ORAL | Status: AC
  Administered 2014-05-19: 20 meq via ORAL
  Filled 2014-05-19: qty 1

## 2014-05-19 NOTE — Discharge Instructions (Signed)
Dehydration, Adult Dehydration is when you lose more fluids from the body than you take in. Vital organs like the kidneys, brain, and heart cannot function without a proper amount of fluids and salt. Any loss of fluids from the body can cause dehydration.  CAUSES   Vomiting.  Diarrhea.  Excessive sweating.  Excessive urine output.  Fever. SYMPTOMS  Mild dehydration  Thirst.  Dry lips.  Slightly dry mouth. Moderate dehydration  Very dry mouth.  Sunken eyes.  Skin does not bounce back quickly when lightly pinched and released.  Dark urine and decreased urine production.  Decreased tear production.  Headache. Severe dehydration  Very dry mouth.  Extreme thirst.  Rapid, weak pulse (more than 100 beats per minute at rest).  Cold hands and feet.  Not able to sweat in spite of heat and temperature.  Rapid breathing.  Blue lips.  Confusion and lethargy.  Difficulty being awakened.  Minimal urine production.  No tears. DIAGNOSIS  Your caregiver will diagnose dehydration based on your symptoms and your exam. Blood and urine tests will help confirm the diagnosis. The diagnostic evaluation should also identify the cause of dehydration. TREATMENT  Treatment of mild or moderate dehydration can often be done at home by increasing the amount of fluids that you drink. It is best to drink small amounts of fluid more often. Drinking too much at one time can make vomiting worse. Refer to the home care instructions below. Severe dehydration needs to be treated at the hospital where you will probably be given intravenous (IV) fluids that contain water and electrolytes. HOME CARE INSTRUCTIONS   Ask your caregiver about specific rehydration instructions.  Drink enough fluids to keep your urine clear or pale yellow.  Drink small amounts frequently if you have nausea and vomiting.  Eat as you normally do.  Avoid:  Foods or drinks high in sugar.  Carbonated  drinks.  Juice.  Extremely hot or cold fluids.  Drinks with caffeine.  Fatty, greasy foods.  Alcohol.  Tobacco.  Overeating.  Gelatin desserts.  Wash your hands well to avoid spreading bacteria and viruses.  Only take over-the-counter or prescription medicines for pain, discomfort, or fever as directed by your caregiver.  Ask your caregiver if you should continue all prescribed and over-the-counter medicines.  Keep all follow-up appointments with your caregiver. SEEK MEDICAL CARE IF:  You have abdominal pain and it increases or stays in one area (localizes).  You have a rash, stiff neck, or severe headache.  You are irritable, sleepy, or difficult to awaken.  You are weak, dizzy, or extremely thirsty. SEEK IMMEDIATE MEDICAL CARE IF:   You are unable to keep fluids down or you get worse despite treatment.  You have frequent episodes of vomiting or diarrhea.  You have blood or green matter (bile) in your vomit.  You have blood in your stool or your stool looks black and tarry.  You have not urinated in 6 to 8 hours, or you have only urinated a small amount of very dark urine.  You have a fever.  You faint. MAKE SURE YOU:   Understand these instructions.  Will watch your condition.  Will get help right away if you are not doing well or get worse. Document Released: 02/24/2005 Document Revised: 05/19/2011 Document Reviewed: 10/14/2010 Exit[employer] Patient Information 2015 [employer]. This information is not intended to replace advice given to you by your health care provider. Make sure you discuss any questions you have with your health care   provider. Hypokalemia Hypokalemia means that the amount of potassium in the blood is lower than normal.Potassium is a chemical, called an electrolyte, that helps regulate the amount of fluid in the body. It also stimulates muscle contraction and helps nerves function properly.Most of the body's potassium is inside of  cells, and only a very small amount is in the blood. Because the amount in the blood is so small, minor changes can be life-threatening. CAUSES  Antibiotics.  Diarrhea or vomiting.  Using laxatives too much, which can cause diarrhea.  Chronic kidney disease.  Water pills (diuretics).  Eating disorders (bulimia).  Low magnesium level.  Sweating a lot. SIGNS AND SYMPTOMS  Weakness.  Constipation.  Fatigue.  Muscle cramps.  Mental confusion.  Skipped heartbeats or irregular heartbeat (palpitations).  Tingling or numbness. DIAGNOSIS  Your health care provider can diagnose hypokalemia with blood tests. In addition to checking your potassium level, your health care provider may also check other lab tests. TREATMENT Hypokalemia can be treated with potassium supplements taken by mouth or adjustments in your current medicines. If your potassium level is very low, you may need to get potassium through a vein (IV) and be monitored in the hospital. A diet high in potassium is also helpful. Foods high in potassium are:  Nuts, such as peanuts and pistachios.  Seeds, such as sunflower seeds and pumpkin seeds.  Peas, lentils, and lima beans.  Whole grain and bran cereals and breads.  Fresh fruit and vegetables, such as apricots, avocado, bananas, cantaloupe, kiwi, oranges, tomatoes, asparagus, and potatoes.  Orange and tomato juices.  Red meats.  Fruit yogurt. HOME CARE INSTRUCTIONS  Take all medicines as prescribed by your health care provider.  Maintain a healthy diet by including nutritious food, such as fruits, vegetables, nuts, whole grains, and lean meats.  If you are taking a laxative, be sure to follow the directions on the label. SEEK MEDICAL CARE IF:  Your weakness gets worse.  You feel your heart pounding or racing.  You are vomiting or having diarrhea.  You are diabetic and having trouble keeping your blood glucose in the normal range. SEEK IMMEDIATE  MEDICAL CARE IF:  You have chest pain, shortness of breath, or dizziness.  You are vomiting or having diarrhea for more than 2 days.  You faint. MAKE SURE YOU:   Understand these instructions.  Will watch your condition.  Will get help right away if you are not doing well or get worse. Document Released: 02/24/2005 Document Revised: 12/15/2012 Document Reviewed: 08/27/2012 Exit[employer] Patient Information 2015 [employer]. This information is not intended to replace advice given to you by your health care provider. Make sure you discuss any questions you have with your health care provider.  

## 2014-05-19 NOTE — ED Notes (Signed)
States he is tired. Hx of low K.  Not drinking or eating.

## 2014-05-19 NOTE — ED Provider Notes (Signed)
CSN: [id number]     Arrival date & time 05/19/14  1719 History   First MD Initiated Contact with Patient 05/19/14 1803     No chief complaint on file.    (Consider location/radiation/quality/duration/timing/severity/associated sxs/prior Treatment) HPI Comments: Patient states approximately 30 days ago developed upper respiratory symptoms of cough, congestion, runny nose, fever which lasted approximately 2 weeks and then started to get better when he developed recurrent symptoms approximately 4-5 days ago with fever, vomiting, cough, wheezing, shortness of breath, runny nose. He initially came to emergency room but left before being seen due to the long wait. He was then seen at his doctor and at that time said he got a shot of antibiotics as well as was placed on a Z-Pak approximately 2 days ago. However for the last 4 days he's also had severe body aches and extreme fatigue. He denies any problems with his urine and no diarrhea. Patient was having symptoms prior to getting the antibiotics. Patient was swabbed for the flu 4 days ago but was negative. He states in the past he has been told he has low potassium and he is concerned he's dehydrated because he has not been eating or drinking  Patient is a 44 y.o. male presenting with URI. The history is provided by the patient.  URI Presenting symptoms: cough and fatigue   Associated symptoms: myalgias and wheezing   Associated symptoms: no headaches     Past Medical History  Diagnosis Date  . Hypertension   . High cholesterol    History reviewed. No pertinent past surgical history. No family history on file. History  Substance Use Topics  . Smoking status: Current Some Day Smoker -- 1.00 packs/day    Types: Cigars  . Smokeless tobacco: Not on file  . Alcohol Use: No    Review of Systems  Constitutional: Positive for fatigue.  Respiratory: Positive for cough and wheezing.   Musculoskeletal: Positive for myalgias.  Neurological:  Negative for headaches.  All other systems reviewed and are negative.     Allergies  Review of patient's allergies indicates no known allergies.  Home Medications   Prior to Admission medications   Medication Sig Start Date End Date Taking? Authorizing Provider  LOSARTAN POTASSIUM-HCTZ PO Take by mouth.   Yes Historical Provider, MD  acetaminophen (TYLENOL) 500 MG tablet Take 1,000 mg by mouth once.    Historical Provider, MD  Calcium-Phosphorus-Vitamin D (CALCIUM GUMMIES PO) Take 3 tablets by mouth daily.      Historical Provider, MD  DM-Phenylephrine-Acetaminophen (TYLENOL COLD MULTI-SYMPTOM) 10-5-325 MG/15ML LIQD Take 30 mLs by mouth once.      Historical Provider, MD  HYDROcodone-acetaminophen (NORCO/VICODIN) 5-325 MG per tablet Take 1-2 tablets by mouth every 6 (six) hours as needed for moderate pain. 04/03/13   Vanetta MuldersScott Zackowski, MD  ibuprofen (ADVIL,MOTRIN) 200 MG tablet Take 400 mg by mouth as needed. Pain     Historical Provider, MD  naproxen (NAPROSYN) 500 MG tablet Take 1 tablet (500 mg total) by mouth 2 (two) times daily. 04/03/13   Vanetta MuldersScott Zackowski, MD  olmesartan-hydrochlorothiazide (BENICAR HCT) 40-25 MG per tablet Take 1 tablet by mouth daily.      Historical Provider, MD  Oseltamivir Phosphate (TAMIFLU PO) Take by mouth.    Historical Provider, MD  promethazine (PHENERGAN) 25 MG tablet Take 1 tablet (25 mg total) by mouth every 6 (six) hours as needed for nausea or vomiting. 04/03/13   Vanetta MuldersScott Zackowski, MD  Pseudoephedrine-APAP-DM (TYLENOL COLD/FLU SEVERE DAY) 60-1000-30  MG/30ML LIQD Take 30 mLs by mouth every 4 (four) hours as needed. For congestion    Historical Provider, MD  Rosuvastatin Calcium (CRESTOR PO) Take 1 tablet by mouth daily.      Historical Provider, MD   BP 141/89 mmHg  Pulse 66  Temp(Src) 98.7 F (37.1 C) (Oral)  Resp 20  Ht  (1.803 m)  Wt 230 lb (104.327 kg)  BMI 32.09 kg/m2  SpO2 100% Physical Exam  Constitutional: He is oriented to person,  place, and time. He appears well-developed and well-nourished. No distress.  HENT:  Head: Normocephalic and atraumatic.  Mouth/Throat: Oropharynx is clear and moist.  Eyes: Conjunctivae and EOM are normal. Pupils are equal, round, and reactive to light.  Neck: Normal range of motion. Neck supple.  Cardiovascular: Normal rate, regular rhythm and intact distal pulses.   No murmur heard. Pulmonary/Chest: Effort normal and breath sounds normal. No respiratory distress. He has no wheezes. He has no rales. He exhibits no tenderness.  Abdominal: Soft. He exhibits no distension. There is no tenderness. There is no rebound and no guarding.  Musculoskeletal: Normal range of motion. He exhibits tenderness. He exhibits no edema.  Generalized muscle tenderness  Neurological: He is alert and oriented to person, place, and time.  Skin: Skin is warm and dry. No rash noted. No erythema.  Psychiatric: He has a normal mood and affect. His behavior is normal.  Nursing note and vitals reviewed.   ED Course  Procedures (including critical care time) Labs Review Labs Reviewed  BASIC METABOLIC PANEL - Abnormal; Notable for the following:    Potassium 2.8 (*)    Glucose, Bld 202 (*)    All other components within normal limits  CBC WITH DIFFERENTIAL/PLATELET  CK  HIV ANTIBODY (ROUTINE TESTING)    Imaging Review No results found.   EKG Interpretation None      MDM   Final diagnoses:  None    Patient here with URI and viral type syndrome for the last 30 days. It initially started to improve and then 4 days ago he developed fever, nausea, vomiting, URI symptoms. He was seen at PCP 2 days ago and at that time was given an IM injection and placed on a Z-Pak. For the last 4 days he just feels extremely fatigued with generalized body aches. He was tested for HIV approximately one year ago which was negative and denies any change in his behavior. On exam vital signs are within normal limits and he is in  no acute distress. No rashes or muscle swelling.  CBC, BMP, CK, HIV pending. Patient given IV fluids  8:35 PM Labs consistent with hypokalemia of 2.8 but otherwise no other acute findings. After IV fluids and potassium patient feels much better was discharged home.  Gwyneth Sprout, MD 05/19/14 2035

## 2014-05-21 ENCOUNTER — Encounter (HOSPITAL_BASED_OUTPATIENT_CLINIC_OR_DEPARTMENT_OTHER): Payer: Self-pay | Admitting: *Deleted

## 2014-05-21 ENCOUNTER — Emergency Department (HOSPITAL_BASED_OUTPATIENT_CLINIC_OR_DEPARTMENT_OTHER)
Admission: EM | Admit: 2014-05-21 | Discharge: 2014-05-21 | Disposition: A | Discharge status: Home / Self Care | Payer: No Typology Code available for payment source | Attending: Emergency Medicine | Admitting: Emergency Medicine

## 2014-05-21 DIAGNOSIS — I10 Essential (primary) hypertension: Secondary | ICD-10-CM | POA: Diagnosis not present

## 2014-05-21 DIAGNOSIS — E876 Hypokalemia: Secondary | ICD-10-CM | POA: Diagnosis not present

## 2014-05-21 DIAGNOSIS — E785 Hyperlipidemia, unspecified: Secondary | ICD-10-CM | POA: Diagnosis not present

## 2014-05-21 DIAGNOSIS — Z791 Long term (current) use of non-steroidal anti-inflammatories (NSAID): Secondary | ICD-10-CM | POA: Diagnosis not present

## 2014-05-21 DIAGNOSIS — R5383 Other fatigue: Secondary | ICD-10-CM | POA: Diagnosis present

## 2014-05-21 DIAGNOSIS — Z79899 Other long term (current) drug therapy: Secondary | ICD-10-CM | POA: Insufficient documentation

## 2014-05-21 LAB — BASIC METABOLIC PANEL
Anion gap: 9 (ref 5–15)
BUN: 13 mg/dL (ref 6–23)
CHLORIDE: 100 mmol/L (ref 96–112)
CO2: 28 mmol/L (ref 19–32)
Calcium: 9 mg/dL (ref 8.4–10.5)
Creatinine, Ser: 0.93 mg/dL (ref 0.50–1.35)
GFR calc Af Amer: >90 mL/min (ref >90)
GFR calc non Af Amer: >90 mL/min (ref >90)
GLUCOSE: 132 mg/dL — AB (ref 70–99)
POTASSIUM: 2.9 mmol/L — AB (ref 3.5–5.1)
SODIUM: 137 mmol/L (ref 135–145)

## 2014-05-21 LAB — HIV ANTIBODY (ROUTINE TESTING W REFLEX): HIV Screen 4th Generation wRfx: NONREACTIVE

## 2014-05-21 LAB — MAGNESIUM: Magnesium: 2.2 mg/dL (ref 1.5–2.5)

## 2014-05-21 MED ORDER — POTASSIUM CHLORIDE 10 MEQ/100ML IV SOLN
10.0000 meq | Freq: Once | INTRAVENOUS | Status: AC
  Administered 2014-05-21: 10 meq via INTRAVENOUS
  Filled 2014-05-21: qty 100

## 2014-05-21 MED ORDER — SODIUM CHLORIDE 0.9 % IV BOLUS (SEPSIS)
1000.0000 mL | Freq: Once | INTRAVENOUS | Status: AC
  Administered 2014-05-21: 1000 mL via INTRAVENOUS

## 2014-05-21 MED ORDER — KETOROLAC TROMETHAMINE 30 MG/ML IJ SOLN
30.0000 mg | Freq: Once | INTRAMUSCULAR | Status: AC
  Administered 2014-05-21: 30 mg via INTRAVENOUS
  Filled 2014-05-21: qty 1

## 2014-05-21 MED ORDER — LOSARTAN POTASSIUM 50 MG PO TABS: 50.0000 mg | ORAL_TABLET | Freq: Every day | ORAL | Status: AC

## 2014-05-21 MED ORDER — POTASSIUM CHLORIDE ER 10 MEQ PO TBCR: 10.0000 meq | EXTENDED_RELEASE_TABLET | Freq: Two times a day (BID) | ORAL | Status: AC

## 2014-05-21 MED ORDER — POTASSIUM CHLORIDE CRYS ER 20 MEQ PO TBCR
40.0000 meq | EXTENDED_RELEASE_TABLET | Freq: Once | ORAL | Status: AC
  Administered 2014-05-21: 40 meq via ORAL
  Filled 2014-05-21: qty 2

## 2014-05-21 NOTE — Discharge Instructions (Signed)
Stop your current prescription of losartan with HCTZ. Begin new prescription of losartan without HCTZ. Prescription for potassium twice per day 10 days.  Hypokalemia Hypokalemia means that the amount of potassium in the blood is lower than normal.Potassium is a chemical, called an electrolyte, that helps regulate the amount of fluid in the body. It also stimulates muscle contraction and helps nerves function properly.Most of the body's potassium is inside of cells, and only a very small amount is in the blood. Because the amount in the blood is so small, minor changes can be life-threatening. CAUSES  Antibiotics.  Diarrhea or vomiting.  Using laxatives too much, which can cause diarrhea.  Chronic kidney disease.  Water pills (diuretics).  Eating disorders (bulimia).  Low magnesium level.  Sweating a lot. SIGNS AND SYMPTOMS  Weakness.  Constipation.  Fatigue.  Muscle cramps.  Mental confusion.  Skipped heartbeats or irregular heartbeat (palpitations).  Tingling or numbness. DIAGNOSIS  Your health care provider can diagnose hypokalemia with blood tests. In addition to checking your potassium level, your health care provider may also check other lab tests. TREATMENT Hypokalemia can be treated with potassium supplements taken by mouth or adjustments in your current medicines. If your potassium level is very low, you may need to get potassium through a vein (IV) and be monitored in the hospital. A diet high in potassium is also helpful. Foods high in potassium are:  Nuts, such as peanuts and pistachios.  Seeds, such as sunflower seeds and pumpkin seeds.  Peas, lentils, and lima beans.  Whole grain and bran cereals and breads.  Fresh fruit and vegetables, such as apricots, avocado, bananas, cantaloupe, kiwi, oranges, tomatoes, asparagus, and potatoes.  Orange and tomato juices.  Red meats.  Fruit yogurt. HOME CARE INSTRUCTIONS  Take all medicines as prescribed  by your health care provider.  Maintain a healthy diet by including nutritious food, such as fruits, vegetables, nuts, whole grains, and lean meats.  If you are taking a laxative, be sure to follow the directions on the label. SEEK MEDICAL CARE IF:  Your weakness gets worse.  You feel your heart pounding or racing.  You are vomiting or having diarrhea.  You are diabetic and having trouble keeping your blood glucose in the normal range. SEEK IMMEDIATE MEDICAL CARE IF:  You have chest pain, shortness of breath, or dizziness.  You are vomiting or having diarrhea for more than 2 days.  You faint. MAKE SURE YOU:   Understand these instructions.  Will watch your condition.  Will get help right away if you are not doing well or get worse. Document Released: 02/24/2005 Document Revised: 12/15/2012 Document Reviewed: 08/27/2012 [facility] Patient Information 2015 [place], [place]. This information is not intended to replace advice given to you by your health care provider. Make sure you discuss any questions you have with your health care provider.  Potassium Content of Foods Potassium is a mineral found in many foods and drinks. It helps keep fluids and minerals balanced in your body and affects how steadily your heart beats. Potassium also helps control your blood pressure and keep your muscles and nervous system healthy. Certain health conditions and medicines may change the balance of potassium in your body. When this happens, you can help balance your level of potassium through the foods that you do or do not eat. Your health care provider or dietitian may recommend an amount of potassium that you should have each day. The following lists of foods provide the amount of potassium (  in parentheses) per serving in each item. HIGH IN POTASSIUM  The following foods and beverages have 200 mg or more of potassium per serving:  Apricots, 2 raw or 5 dry (200 mg).  Artichoke, 1 medium (345  mg).  Avocado, raw,  each (245 mg).  Banana, 1 medium (425 mg).  Beans, lima, or baked beans, canned,  cup (280 mg).  Beans, white, canned,  cup (595 mg).  Beef roast, 3 oz (320 mg).  Beef, ground, 3 oz (270 mg).  Beets, raw or cooked,  cup (260 mg).  Bran muffin, 2 oz (300 mg).  Broccoli,  cup (230 mg).  Brussels sprouts,  cup (250 mg).  Cantaloupe,  cup (215 mg).  Cereal, 100% bran,  cup (200-400 mg).  Cheeseburger, single, fast food, 1 each (225-400 mg).  Chicken, 3 oz (220 mg).  Clams, canned, 3 oz (535 mg).  Crab, 3 oz (225 mg).  Dates, 5 each (270 mg).  Dried beans and peas,  cup (300-475 mg).  Figs, dried, 2 each (260 mg).  Fish: halibut, tuna, cod, snapper, 3 oz (480 mg).  Fish: salmon, haddock, swordfish, perch, 3 oz (300 mg).  Fish, tuna, canned 3 oz (200 mg).  [place] fries, fast food, 3 oz (470 mg).  Granola with fruit and nuts,  cup (200 mg).  Grapefruit juice,  cup (200 mg).  Greens, beet,  cup (655 mg).  Honeydew melon,  cup (200 mg).  Kale, raw, 1 cup (300 mg).  Kiwi, 1 medium (240 mg).  Kohlrabi, rutabaga, parsnips,  cup (280 mg).  Lentils,  cup (365 mg).  Mango, 1 each (325 mg).  Milk, chocolate, 1 cup (420 mg).  Milk: nonfat, low-fat, whole, buttermilk, 1 cup (350-380 mg).  Molasses, 1 Tbsp (295 mg).  Mushrooms,  cup (280) mg.  Nectarine, 1 each (275 mg).  Nuts: almonds, peanuts, hazelnuts, [place], cashew, mixed, 1 oz (200 mg).  Nuts, pistachios, 1 oz (295 mg).  Orange, 1 each (240 mg).  Orange juice,  cup (235 mg).  Papaya, medium,  fruit (390 mg).  Peanut butter, chunky, 2 Tbsp (240 mg).  Peanut butter, smooth, 2 Tbsp (210 mg).  Pear, 1 medium (200 mg).  Pomegranate, 1 whole (400 mg).  Pomegranate juice,  cup (215 mg).  Pork, 3 oz (350 mg).  Potato chips, salted, 1 oz (465 mg).  Potato, baked with skin, 1 medium (925 mg).  Potatoes, boiled,  cup (255 mg).  Potatoes, mashed,   cup (330 mg).  Prune juice,  cup (370 mg).  Prunes, 5 each (305 mg).  Pudding, chocolate,  cup (230 mg).  Pumpkin, canned,  cup (250 mg).  Raisins, seedless,  cup (270 mg).  Seeds, sunflower or pumpkin, 1 oz (240 mg).  Soy milk, 1 cup (300 mg).  Spinach,  cup (420 mg).  Spinach, canned,  cup (370 mg).  Sweet potato, baked with skin, 1 medium (450 mg).  Swiss chard,  cup (480 mg).  Tomato or vegetable juice,  cup (275 mg).  Tomato sauce or puree,  cup (400-550 mg).  Tomato, raw, 1 medium (290 mg).  Tomatoes, canned,  cup (200-300 mg).  [place], 3 oz (250 mg).  Wheat germ, 1 oz (250 mg).  Winter squash,  cup (250 mg).  Yogurt, plain or fruited, 6 oz (260-435 mg).  Zucchini,  cup (220 mg). MODERATE IN POTASSIUM The following foods and beverages have 50-200 mg of potassium per serving:  Apple, 1 each (150 mg).  Apple juice,  cup (150 mg).  Applesauce,  cup (90 mg).  Apricot nectar,  cup (140 mg).  Asparagus, small spears,  cup or 6 spears (155 mg).  Bagel, cinnamon raisin, 1 each (130 mg).  Bagel, egg or plain, 4 in., 1 each (70 mg).  Beans, green,  cup (90 mg).  Beans, yellow,  cup (190 mg).  Beer, regular, 12 oz (100 mg).  Beets, canned,  cup (125 mg).  Blackberries,  cup (115 mg).  Blueberries,  cup (60 mg).  Bread, whole wheat, 1 slice (70 mg).  Broccoli, raw,  cup (145 mg).  Cabbage,  cup (150 mg).  Carrots, cooked or raw,  cup (180 mg).  Cauliflower, raw,  cup (150 mg).  Celery, raw,  cup (155 mg).  Cereal, bran flakes, cup (120-150 mg).  Cheese, cottage,  cup (110 mg).  Cherries, 10 each (150 mg).  Chocolate, 1 oz bar (165 mg).  Coffee, brewed 6 oz (90 mg).  Corn,  cup or 1 ear (195 mg).  Cucumbers,  cup (80 mg).  Egg, large, 1 each (60 mg).  Eggplant,  cup (60 mg).  Endive, raw, cup (80 mg).  English muffin, 1 each (65 mg).  Fish, orange roughy, 3 oz (150 mg).  Frankfurter,  beef or pork, 1 each (75 mg).  Fruit cocktail,  cup (115 mg).  Grape juice,  cup (170 mg).  Grapefruit,  fruit (175 mg).  Grapes,  cup (155 mg).  Greens: kale, turnip, collard,  cup (110-150 mg).  Ice cream or frozen yogurt, chocolate,  cup (175 mg).  Ice cream or frozen yogurt, vanilla,  cup (120-150 mg).  Lemons, limes, 1 each (80 mg).  Lettuce, all types, 1 cup (100 mg).  Mixed vegetables,  cup (150 mg).  Mushrooms, raw,  cup (110 mg).  Nuts: walnuts, pecans, or macadamia, 1 oz (125 mg).  Oatmeal,  cup (80 mg).  Okra,  cup (110 mg).  Onions, raw,  cup (120 mg).  Peach, 1 each (185 mg).  Peaches, canned,  cup (120 mg).  Pears, canned,  cup (120 mg).  Peas, green, frozen,  cup (90 mg).  Peppers, green,  cup (130 mg).  Peppers, red,  cup (160 mg).  Pineapple juice,  cup (165 mg).  Pineapple, fresh or canned,  cup (100 mg).  Plums, 1 each (105 mg).  Pudding, vanilla,  cup (150 mg).  Raspberries,  cup (90 mg).  Rhubarb,  cup (115 mg).  Rice, wild,  cup (80 mg).  Shrimp, 3 oz (155 mg).  Spinach, raw, 1 cup (170 mg).  Strawberries,  cup (125 mg).  Summer squash  cup (175-200 mg).  Swiss chard, raw, 1 cup (135 mg).  Tangerines, 1 each (140 mg).  Tea, brewed, 6 oz (65 mg).  Turnips,  cup (140 mg).  Watermelon,  cup (85 mg).  Wine, red, table, 5 oz (180 mg).  Wine, white, table, 5 oz (100 mg). LOW IN POTASSIUM The following foods and beverages have less than 50 mg of potassium per serving.  Bread, white, 1 slice (30 mg).  Carbonated beverages, 12 oz (less than 5 mg).  Cheese, 1 oz (20-30 mg).  Cranberries,  cup (45 mg).  Cranberry juice cocktail,  cup (20 mg).  Fats and oils, 1 Tbsp (less than 5 mg).  Hummus, 1 Tbsp (32 mg).  Nectar: papaya, mango, or pear,  cup (35 mg).  Rice, white or brown,  cup (50 mg).  Spaghetti or macaroni,  cup cooked (30 mg).  Tortilla, flour or corn, 1 each (50  mg).  Waffle, 4 in., 1 each (50 mg).  Water chestnuts,  cup (40 mg). Document Released: 10/08/2004 Document Revised: 03/01/2013 Document Reviewed: 01/21/2013 [facility] Patient Information 2015 [place], [place]. This information is not intended to replace advice given to you by your health care provider. Make sure you discuss any questions you have with your health care provider.

## 2014-05-21 NOTE — ED Provider Notes (Signed)
CSN: [id number]     Arrival date & time 05/21/14  [id number] History   First MD Initiated Contact with Patient 05/21/14 [phone]     Chief Complaint  Patient presents with  . Fatigue     HPI  She presents evaluation of continued weakness. Has had a several week illness. Initially diagnosed with influenza empirically, and treated with Tamiflu. Thinning treated by his primary care physician with Zithromax. Seen here 2 days ago. Continued with cough of felt muscle ache and generalized malaise and weakness. Lajoyce CornersDuda have a mild hypokalemia 2.8. States she's been attempting to eat fruits and vegetables. Continues with weakness and muscle aches.   No fevers chills nausea vomiting diarrhea improving cough.    Past Medical History  Diagnosis Date  . Hypertension   . High cholesterol    History reviewed. No pertinent past surgical history. No family history on file. History  Substance Use Topics  . Smoking status: Current Some Day Smoker -- 1.00 packs/day    Types: Cigars  . Smokeless tobacco: Not on file  . Alcohol Use: No    Review of Systems  Constitutional: Negative for fever, chills, diaphoresis, appetite change and fatigue.  HENT: Negative for mouth sores, sore throat and trouble swallowing.   Eyes: Negative for visual disturbance.  Respiratory: Negative for cough, chest tightness, shortness of breath and wheezing.   Cardiovascular: Negative for chest pain.  Gastrointestinal: Negative for nausea, vomiting, abdominal pain, diarrhea and abdominal distention.  Endocrine: Negative for polydipsia, polyphagia and polyuria.  Genitourinary: Negative for dysuria, frequency and hematuria.  Musculoskeletal: Positive for myalgias. Negative for gait problem.  Skin: Negative for color change, pallor and rash.  Neurological: Positive for weakness. Negative for dizziness, syncope, light-headedness and headaches.  Hematological: Does not bruise/bleed easily.  Psychiatric/Behavioral: Negative for behavioral  problems and confusion.      Allergies  Review of patient's allergies indicates no known allergies.  Home Medications   Prior to Admission medications   Medication Sig Start Date End Date Taking? Authorizing Provider  acetaminophen (TYLENOL) 500 MG tablet Take 1,000 mg by mouth once.    Historical Provider, MD  Calcium-Phosphorus-Vitamin D (CALCIUM GUMMIES PO) Take 3 tablets by mouth daily.      Historical Provider, MD  DM-Phenylephrine-Acetaminophen (TYLENOL COLD MULTI-SYMPTOM) 10-5-325 MG/15ML LIQD Take 30 mLs by mouth once.      Historical Provider, MD  HYDROcodone-acetaminophen (NORCO/VICODIN) 5-325 MG per tablet Take 1-2 tablets by mouth every 6 (six) hours as needed for moderate pain. 04/03/13   Vanetta MuldersScott Zackowski, MD  ibuprofen (ADVIL,MOTRIN) 200 MG tablet Take 400 mg by mouth as needed. Pain     Historical Provider, MD  losartan (COZAAR) 50 MG tablet Take 1 tablet (50 mg total) by mouth daily. 05/21/14   Rolland PorterMark Christohper Dube, MD  LOSARTAN POTASSIUM-HCTZ PO Take by mouth.    Historical Provider, MD  naproxen (NAPROSYN) 500 MG tablet Take 1 tablet (500 mg total) by mouth 2 (two) times daily. 04/03/13   Vanetta MuldersScott Zackowski, MD  olmesartan-hydrochlorothiazide (BENICAR HCT) 40-25 MG per tablet Take 1 tablet by mouth daily.      Historical Provider, MD  Oseltamivir Phosphate (TAMIFLU PO) Take by mouth.    Historical Provider, MD  potassium chloride (K-DUR) 10 MEQ tablet Take 1 tablet (10 mEq total) by mouth 2 (two) times daily. 05/21/14   Rolland PorterMark Chanda Laperle, MD  promethazine (PHENERGAN) 25 MG tablet Take 1 tablet (25 mg total) by mouth every 6 (six) hours as needed for nausea or vomiting. 04/03/13  Vanetta Mulders, MD  Pseudoephedrine-APAP-DM (TYLENOL COLD/FLU SEVERE DAY) 60-1000-30 MG/30ML LIQD Take 30 mLs by mouth every 4 (four) hours as needed. For congestion    Historical Provider, MD  Rosuvastatin Calcium (CRESTOR PO) Take 1 tablet by mouth daily.      Historical Provider, MD   BP 153/104 mmHg  Pulse 57   Temp(Src) 98.3 F (36.8 C) (Oral)  Resp 18  Ht  (1.803 m)  Wt 218 lb (98.884 kg)  BMI 30.42 kg/m2  SpO2 99% Physical Exam  Constitutional: He is oriented to person, place, and time. He appears well-developed and well-nourished. No distress.  HENT:  Head: Normocephalic.  Eyes: Conjunctivae are normal. Pupils are equal, round, and reactive to light. No scleral icterus.  Neck: Normal range of motion. Neck supple. No thyromegaly present.  Cardiovascular: Normal rate and regular rhythm.  Exam reveals no gallop and no friction rub.   No murmur heard. Pulmonary/Chest: Effort normal and breath sounds normal. No respiratory distress. He has no wheezes. He has no rales.  There are bilateral breath sounds. No increased worker breathing.  Abdominal: Soft. Bowel sounds are normal. He exhibits no distension. There is no tenderness. There is no rebound.  Musculoskeletal: Normal range of motion.  No specific areas of muscle tenderness. No rash nor erythema.  Neurological: He is alert and oriented to person, place, and time.  Skin: Skin is warm and dry. No rash noted.  Psychiatric: He has a normal mood and affect. His behavior is normal.    ED Course  Procedures (including critical care time) Labs Review Labs Reviewed  BASIC METABOLIC PANEL - Abnormal; Notable for the following:    Potassium 2.9 (*)    Glucose, Bld 132 (*)    All other components within normal limits  MAGNESIUM    Imaging Review No results found.   EKG Interpretation None      MDM   Final diagnoses:  Hypokalemia    History remains hypokalemic. Afebrile. Not hypoxemic. Plan will be potassium replacement. We'll change him from his antihypertensive/diuretic combination to a medication without hydrochlorothiazide. Potassium replacement. Rest and hydration.    Rolland Porter, MD 05/21/14 [phone]

## 2014-05-21 NOTE — ED Notes (Addendum)
Patient states that he had the flu a month ago and still continues to feel fatigued and has muscle aches. He states he feels better after using a massager.

## 2014-07-19 ENCOUNTER — Emergency Department (HOSPITAL_BASED_OUTPATIENT_CLINIC_OR_DEPARTMENT_OTHER): Payer: No Typology Code available for payment source

## 2014-07-19 ENCOUNTER — Emergency Department (HOSPITAL_BASED_OUTPATIENT_CLINIC_OR_DEPARTMENT_OTHER)
Admission: EM | Admit: 2014-07-19 | Discharge: 2014-07-19 | Disposition: A | Discharge status: Home / Self Care | Payer: No Typology Code available for payment source | Attending: Emergency Medicine | Admitting: Emergency Medicine

## 2014-07-19 ENCOUNTER — Encounter (HOSPITAL_BASED_OUTPATIENT_CLINIC_OR_DEPARTMENT_OTHER): Payer: Self-pay | Admitting: *Deleted

## 2014-07-19 DIAGNOSIS — X58XXXA Exposure to other specified factors, initial encounter: Secondary | ICD-10-CM | POA: Diagnosis not present

## 2014-07-19 DIAGNOSIS — Y9389 Activity, other specified: Secondary | ICD-10-CM | POA: Insufficient documentation

## 2014-07-19 DIAGNOSIS — Z79899 Other long term (current) drug therapy: Secondary | ICD-10-CM | POA: Insufficient documentation

## 2014-07-19 DIAGNOSIS — Y998 Other external cause status: Secondary | ICD-10-CM | POA: Insufficient documentation

## 2014-07-19 DIAGNOSIS — Z720 Tobacco use: Secondary | ICD-10-CM | POA: Insufficient documentation

## 2014-07-19 DIAGNOSIS — Y9289 Other specified places as the place of occurrence of the external cause: Secondary | ICD-10-CM | POA: Insufficient documentation

## 2014-07-19 DIAGNOSIS — S4991XA Unspecified injury of right shoulder and upper arm, initial encounter: Secondary | ICD-10-CM | POA: Diagnosis not present

## 2014-07-19 DIAGNOSIS — M7531 Calcific tendinitis of right shoulder: Secondary | ICD-10-CM | POA: Diagnosis not present

## 2014-07-19 DIAGNOSIS — E780 Pure hypercholesterolemia: Secondary | ICD-10-CM | POA: Diagnosis not present

## 2014-07-19 DIAGNOSIS — I10 Essential (primary) hypertension: Secondary | ICD-10-CM | POA: Diagnosis not present

## 2014-07-19 DIAGNOSIS — M25511 Pain in right shoulder: Secondary | ICD-10-CM

## 2014-07-19 MED ORDER — OXYCODONE-ACETAMINOPHEN 5-325 MG PO TABS
2.0000 | ORAL_TABLET | Freq: Once | ORAL | Status: AC
  Administered 2014-07-19: 2 via ORAL
  Filled 2014-07-19: qty 2

## 2014-07-19 MED ORDER — OXYCODONE-ACETAMINOPHEN 5-325 MG PO TABS: 1.0000 | ORAL_TABLET | Freq: Four times a day (QID) | ORAL | Status: AC | PRN

## 2014-07-19 NOTE — ED Provider Notes (Signed)
CSN: [id number]     Arrival date & time 07/19/14  1616 History   First MD Initiated Contact with Patient 07/19/14 1652     Chief Complaint  Patient presents with  . Shoulder Injury     (Consider location/radiation/quality/duration/timing/severity/associated sxs/prior Treatment) HPI Comments: 44 year old male complaining of right shoulder pain 1 day. Reports he was lifting something heavy when he suddenly developed severe, sharp and aching right shoulder pain, radiating down his arm. Pain worse with any movement. Tried taking naproxen with no relief. Denies numbness or tingling. No prior injury to his right shoulder.  Patient is a 44 y.o. male presenting with shoulder injury. The history is provided by the patient.  Shoulder Injury Pertinent negatives include no chest pain or numbness.    Past Medical History  Diagnosis Date  . Hypertension   . High cholesterol    History reviewed. No pertinent past surgical history. History reviewed. No pertinent family history. History  Substance Use Topics  . Smoking status: Current Some Day Smoker -- 1.00 packs/day    Types: Cigars  . Smokeless tobacco: Not on file  . Alcohol Use: No    Review of Systems  Constitutional: Negative.   Respiratory: Negative for shortness of breath.   Cardiovascular: Negative for chest pain.  Musculoskeletal:       + R shoulder pain.  Skin: Negative for wound.  Neurological: Negative for numbness.      Allergies  Review of patient's allergies indicates no known allergies.  Home Medications   Prior to Admission medications   Medication Sig Start Date End Date Taking? Authorizing Provider  acetaminophen (TYLENOL) 500 MG tablet Take 1,000 mg by mouth once.    Historical Provider, MD  Calcium-Phosphorus-Vitamin D (CALCIUM GUMMIES PO) Take 3 tablets by mouth daily.      Historical Provider, MD  DM-Phenylephrine-Acetaminophen (TYLENOL COLD MULTI-SYMPTOM) 10-5-325 MG/15ML LIQD Take 30 mLs by mouth once.       Historical Provider, MD  HYDROcodone-acetaminophen (NORCO/VICODIN) 5-325 MG per tablet Take 1-2 tablets by mouth every 6 (six) hours as needed for moderate pain. 04/03/13   Vanetta MuldersScott Zackowski, MD  ibuprofen (ADVIL,MOTRIN) 200 MG tablet Take 400 mg by mouth as needed. Pain     Historical Provider, MD  losartan (COZAAR) 50 MG tablet Take 1 tablet (50 mg total) by mouth daily. 05/21/14   Rolland PorterMark James, MD  LOSARTAN POTASSIUM-HCTZ PO Take by mouth.    Historical Provider, MD  naproxen (NAPROSYN) 500 MG tablet Take 1 tablet (500 mg total) by mouth 2 (two) times daily. 04/03/13   Vanetta MuldersScott Zackowski, MD  olmesartan-hydrochlorothiazide (BENICAR HCT) 40-25 MG per tablet Take 1 tablet by mouth daily.      Historical Provider, MD  Oseltamivir Phosphate (TAMIFLU PO) Take by mouth.    Historical Provider, MD  oxyCODONE-acetaminophen (PERCOCET) 5-325 MG per tablet Take 1-2 tablets by mouth every 6 (six) hours as needed for severe pain. 07/19/14   Zaley Talley M Sacora Hawbaker, PA-C  potassium chloride (K-DUR) 10 MEQ tablet Take 1 tablet (10 mEq total) by mouth 2 (two) times daily. 05/21/14   Rolland PorterMark James, MD  promethazine (PHENERGAN) 25 MG tablet Take 1 tablet (25 mg total) by mouth every 6 (six) hours as needed for nausea or vomiting. 04/03/13   Vanetta MuldersScott Zackowski, MD  Pseudoephedrine-APAP-DM (TYLENOL COLD/FLU SEVERE DAY) 60-1000-30 MG/30ML LIQD Take 30 mLs by mouth every 4 (four) hours as needed. For congestion    Historical Provider, MD  Rosuvastatin Calcium (CRESTOR PO) Take 1 tablet by mouth  daily.      Historical Provider, MD   BP 155/104 mmHg  Pulse 69  Temp(Src) 98.9 F (37.2 C) (Oral)  Resp 16  Ht 6' (1.829 m)  Wt 218 lb (98.884 kg)  BMI 29.56 kg/m2  SpO2 98% Physical Exam  Constitutional: He is oriented to person, place, and time. He appears well-developed and well-nourished. No distress.  HENT:  Head: Normocephalic and atraumatic.  Eyes: Conjunctivae and EOM are normal.  Neck: Normal range of motion. Neck supple.   Cardiovascular: Normal rate, regular rhythm and normal heart sounds.   +2 radial pulse on R.  Pulmonary/Chest: Effort normal and breath sounds normal.  Musculoskeletal: He exhibits no edema.  R shoulder very tender throughout. ROM limited due to pain. No swelling or deformity. Elbow and c-spine normal. Normal grip strength.   Neurological: He is alert and oriented to person, place, and time.  Skin: Skin is warm and dry.  Psychiatric: He has a normal mood and affect. His behavior is normal.  Nursing note and vitals reviewed.   ED Course  Procedures (including critical care time) Labs Review Labs Reviewed - No data to display  Imaging Review Dg Shoulder Right  07/19/2014   CLINICAL DATA:  Shoulder injury with pain  EXAM: RIGHT SHOULDER - 2+ VIEW  COMPARISON:  None.  FINDINGS: Normal alignment.  No fracture.  Downsloping acromion. Soft tissue calcification lateral to the greater tuberosity consistent with calcific tendinitis. Glenohumeral joint normal.  IMPRESSION: Calcific tendinitis with anatomy favorable to rotator cuff impingement.   Electronically Signed   By: Marlan Palauharles  Clark M.D.   On: 07/19/2014 17:00     EKG Interpretation None      MDM   Final diagnoses:  Calcific tendinitis of right shoulder  Right shoulder pain   Uncomfortable but in no apparent distress. Hypertensive on arrival but in a significant amount of pain. X-ray showing calcific tendinitis with anatomy favorable to rotator cuff impingement. I discussed these findings with patient, applied sling for comfort. Percocet given for pain which improved blood pressure. I advised him to follow-up with orthopedics. Will discharge home with Rx for Percocet. Advised ice and NSAIDs as well. Stable for discharge. Return precautions given. Patient states understanding of treatment care plan and is agreeable.  Kathrynn SpeedRobyn M Kurtiss Wence, PA-C 07/19/14 1740  Zadie Rhineonald Wickline, MD 07/19/14 1944

## 2014-07-19 NOTE — Discharge Instructions (Signed)
Take percocet for severe pain only. No driving or operating heavy machinery while taking percocet. This medication may cause drowsiness.  Calcific Tendinitis Calcific tendinitis occurs when crystals of calcium are deposited in a tendon. Tendons are bands of strong, fibrous tissue that attach muscles to bones. Tendons are an important part of joints. They make the joint move and they absorb some of the stress that a joint receives during use. When calcium is deposited in the tendon, the tendon becomes stiff, painful, and it can become swollen. Calcific tendinitis occurs frequently in the shoulder joint, in a structure called the rotator cuff. CAUSES  The cause of calcific tendinitis is unclear. It may be associated with:  Overuse of the tendon, such as from repetitive motion.  Excess stress on the tendon.  Aging.  Repetitive, mild injuries. SYMPTOMS   Pain may or may not be present. If it is present, it may occur when moving the joint.  Tenderness when pressure is applied to the tendon.  A snapping or popping sound when the joint moves.  Decreased motion of the joint.  Difficulty sleeping due to pain in the joint. DIAGNOSIS  Your health care provider will perform a physical exam. Imaging tests may also be used to make the diagnosis. These may include X-rays, an MRI, or a CT scan. TREATMENT  Generally, calcific tendinitis resolves on its own. Treatment for pain of calcific tendinitis may include:  Taking over-the-counter medicines, such as anti-inflammatory drugs.  Applying ice packs to the joint.  Following a specific exercise program to keep the joint working properly.  Attending physical therapy sessions.  Avoiding activities that cause pain. Treatment for more severe calcific tendinitis may require:  Injecting cortisone steroids or pain relieving medicines into or around the joint.  Manipulating the joint after you are given medicine to numb the area (local  anesthetic).  Inflating the joint with sterile fluid to increase the flexibility of the tendons.  Shockwave therapy, which involves focusing sound waves on the joint. If other treatments do not work, surgery may be done to clean out the calcium deposits and repair the tendons where needed. Most people do not need surgery. HOME CARE INSTRUCTIONS   Only take over-the-counter or prescription medicines for pain, fever, or discomfort as directed by your health care provider.  Follow your health care provider's recommendations for activity and exercise. SEEK MEDICAL CARE IF:  You notice an increase in pain or numbness.  You develop new weakness.  You notice increased joint stiffness or a sensation of looseness in the joint.  You notice increasing redness, swelling, or warmth around the joint area. SEEK IMMEDIATE MEDICAL CARE IF:  You have a fever or persistent symptoms for more than 2 to 3 days.  You have a fever and your symptoms suddenly get worse. MAKE SURE YOU:  Understand these instructions.  Will watch your condition.  Will get help right away if you are not doing well or get worse. Document Released: 12/04/2007 Document Revised: 07/11/2013 Document Reviewed: 06/05/2011 [facility] Patient Information 2015 [place], [place]. This information is not intended to replace advice given to you by your health care provider. Make sure you discuss any questions you have with your health care provider.  Rotator Cuff Tendinitis  Rotator cuff tendinitis is inflammation of the tough, cord-like bands that connect muscle to bone (tendons) in your rotator cuff. Your rotator cuff is the collection of all the muscles and tendons that connect your arm to your shoulder. Your rotator cuff holds the  head of your upper arm bone (humerus) in the cup (fossa) of your shoulder blade (scapula). CAUSES Rotator cuff tendinitis is usually caused by overusing the joint involved.  SIGNS AND SYMPTOMS  Deep ache in  the shoulder also felt on the outside upper arm over the shoulder muscle.  Point tenderness over the area that is injured.  Pain comes on gradually and becomes worse with lifting the arm to the side (abduction) or turning it inward (internal rotation).  May lead to a chronic tear: When a rotator cuff tendon becomes inflamed, it runs the risk of losing its blood supply, causing some tendon fibers to die. This increases the risk that the tendon can fray and partially or completely tear. DIAGNOSIS Rotator cuff tendinitis is diagnosed by taking a medical history, performing a physical exam, and reviewing results of imaging exams. The medical history is useful to help determine the type of rotator cuff injury. The physical exam will include looking at the injured shoulder, feeling the injured area, and watching you do range-of-motion exercises. X-ray exams are typically done to rule out other causes of shoulder pain, such as fractures. MRI is the imaging exam usually used for significant shoulder injuries. Sometimes a dye study called CT arthrogram is done, but it is not as widely used as MRI. In some institutions, special ultrasound tests may also be used to aid in the diagnosis. TREATMENT  Less Severe Cases  Use of a sling to rest the shoulder for a short period of time. Prolonged use of the sling can cause stiffness, weakness, and loss of motion of the shoulder joint.  Anti-inflammatory medicines, such as ibuprofen or naproxen sodium, may be prescribed. More Severe Cases  Physical therapy.  Use of steroid injections into the shoulder joint.  Surgery. HOME CARE INSTRUCTIONS   Use a sling or splint until the pain decreases. Prolonged use of the sling can cause stiffness, weakness, and loss of motion of the shoulder joint.  Apply ice to the injured area:  Put ice in a plastic bag.  Place a towel between your skin and the bag.  Leave the ice on for 20 minutes, 2-3 times a day.  Try to  avoid use other than gentle range of motion while your shoulder is painful. Use the shoulder and exercise only as directed by your health care provider. Stop exercises or range of motion if pain or discomfort increases, unless directed otherwise by your health care provider.  Only take over-the-counter or prescription medicines for pain, discomfort, or fever as directed by your health care provider.  If you were given a shoulder sling and straps (immobilizer), do not remove it except as directed, or until you see a health care provider for a follow-up exam. If you need to remove it, move your arm as little as possible or as directed.  You may want to sleep on several pillows at night to lessen swelling and pain. SEEK IMMEDIATE MEDICAL CARE IF:   Your shoulder pain increases or new pain develops in your arm, hand, or fingers and is not relieved with medicines.  You have new, unexplained symptoms, especially increased numbness in the hands or loss of strength.  You develop any worsening of the problems that brought you in for care.  Your arm, hand, or fingers are numb or tingling.  Your arm, hand, or fingers are swollen, painful, or turn white or blue. MAKE SURE YOU:  Understand these instructions.  Will watch your condition.  Will get help right  away if you are not doing well or get worse. Document Released: 05/17/2003 Document Revised: 12/15/2012 Document Reviewed: 10/06/2012 [facility] Patient Information 2015 [place], [place]. This information is not intended to replace advice given to you by your health care provider. Make sure you discuss any questions you have with your health care provider.  Shoulder Pain The shoulder is the joint that connects your arms to your body. The bones that form the shoulder joint include the upper arm bone (humerus), the shoulder blade (scapula), and the collarbone (clavicle). The top of the humerus is shaped like a ball and fits into a rather flat socket on  the scapula (glenoid cavity). A combination of muscles and strong, fibrous tissues that connect muscles to bones (tendons) support your shoulder joint and hold the ball in the socket. Small, fluid-filled sacs (bursae) are located in different areas of the joint. They act as cushions between the bones and the overlying soft tissues and help reduce friction between the gliding tendons and the bone as you move your arm. Your shoulder joint allows a wide range of motion in your arm. This range of motion allows you to do things like scratch your back or throw a ball. However, this range of motion also makes your shoulder more prone to pain from overuse and injury. Causes of shoulder pain can originate from both injury and overuse and usually can be grouped in the following four categories:  Redness, swelling, and pain (inflammation) of the tendon (tendinitis) or the bursae (bursitis).  Instability, such as a dislocation of the joint.  Inflammation of the joint (arthritis).  Broken bone (fracture). HOME CARE INSTRUCTIONS   Apply ice to the sore area.  Put ice in a plastic bag.  Place a towel between your skin and the bag.  Leave the ice on for 15-20 minutes, 3-4 times per day for the first 2 days, or as directed by your health care provider.  Stop using cold packs if they do not help with the pain.  If you have a shoulder sling or immobilizer, wear it as long as your caregiver instructs. Only remove it to shower or bathe. Move your arm as little as possible, but keep your hand moving to prevent swelling.  Squeeze a soft ball or foam pad as much as possible to help prevent swelling.  Only take over-the-counter or prescription medicines for pain, discomfort, or fever as directed by your caregiver. SEEK MEDICAL CARE IF:   Your shoulder pain increases, or new pain develops in your arm, hand, or fingers.  Your hand or fingers become cold and numb.  Your pain is not relieved with  medicines. SEEK IMMEDIATE MEDICAL CARE IF:   Your arm, hand, or fingers are numb or tingling.  Your arm, hand, or fingers are significantly swollen or turn white or blue. MAKE SURE YOU:   Understand these instructions.  Will watch your condition.  Will get help right away if you are not doing well or get worse. Document Released: 12/04/2004 Document Revised: 07/11/2013 Document Reviewed: 02/08/2011 [facility] Patient Information 2015 [place]. This information is not intended to replace advice given to you by your health care provider. Make sure you discuss any questions you have with your health care provider.

## 2014-07-19 NOTE — ED Notes (Signed)
Pt was placed in sling and has pain 10/10 with this motion.

## 2014-07-19 NOTE — ED Notes (Signed)
Pt c/o right shoulder injury x 1 day ago after lifting something heavy

## 2014-10-08 ENCOUNTER — Emergency Department (HOSPITAL_BASED_OUTPATIENT_CLINIC_OR_DEPARTMENT_OTHER)
Admission: EM | Admit: 2014-10-08 | Discharge: 2014-10-09 | Disposition: A | Discharge status: Home / Self Care | Payer: No Typology Code available for payment source | Attending: Emergency Medicine | Admitting: Emergency Medicine

## 2014-10-08 ENCOUNTER — Encounter (HOSPITAL_BASED_OUTPATIENT_CLINIC_OR_DEPARTMENT_OTHER): Payer: Self-pay | Admitting: *Deleted

## 2014-10-08 DIAGNOSIS — T675XXA Heat exhaustion, unspecified, initial encounter: Secondary | ICD-10-CM | POA: Insufficient documentation

## 2014-10-08 DIAGNOSIS — X58XXXA Exposure to other specified factors, initial encounter: Secondary | ICD-10-CM | POA: Insufficient documentation

## 2014-10-08 DIAGNOSIS — Y9389 Activity, other specified: Secondary | ICD-10-CM | POA: Diagnosis not present

## 2014-10-08 DIAGNOSIS — Z720 Tobacco use: Secondary | ICD-10-CM | POA: Diagnosis not present

## 2014-10-08 DIAGNOSIS — Y9289 Other specified places as the place of occurrence of the external cause: Secondary | ICD-10-CM | POA: Diagnosis not present

## 2014-10-08 DIAGNOSIS — E780 Pure hypercholesterolemia: Secondary | ICD-10-CM | POA: Diagnosis not present

## 2014-10-08 DIAGNOSIS — Z79899 Other long term (current) drug therapy: Secondary | ICD-10-CM | POA: Diagnosis not present

## 2014-10-08 DIAGNOSIS — E860 Dehydration: Secondary | ICD-10-CM | POA: Diagnosis present

## 2014-10-08 DIAGNOSIS — I10 Essential (primary) hypertension: Secondary | ICD-10-CM | POA: Insufficient documentation

## 2014-10-08 DIAGNOSIS — Z791 Long term (current) use of non-steroidal anti-inflammatories (NSAID): Secondary | ICD-10-CM | POA: Diagnosis not present

## 2014-10-08 DIAGNOSIS — Y998 Other external cause status: Secondary | ICD-10-CM | POA: Insufficient documentation

## 2014-10-08 LAB — I-STAT CHEM 8, ED
BUN: 15 mg/dL (ref 6–20)
Calcium, Ion: 1.22 mmol/L (ref 1.12–1.23)
Chloride: 99 mmol/L — ABNORMAL LOW (ref 101–111)
Creatinine, Ser: 1 mg/dL (ref 0.61–1.24)
Glucose, Bld: 107 mg/dL — ABNORMAL HIGH (ref 65–99)
HCT: 47 % (ref 39.0–52.0)
HEMOGLOBIN: 16 g/dL (ref 13.0–17.0)
Potassium: 3.2 mmol/L — ABNORMAL LOW (ref 3.5–5.1)
SODIUM: 139 mmol/L (ref 135–145)
TCO2: 24 mmol/L (ref 0–100)

## 2014-10-08 MED ORDER — IBUPROFEN 800 MG PO TABS
800.0000 mg | ORAL_TABLET | Freq: Once | ORAL | Status: AC
  Administered 2014-10-08: 800 mg via ORAL
  Filled 2014-10-08: qty 1

## 2014-10-08 MED ORDER — SODIUM CHLORIDE 0.9 % IV BOLUS (SEPSIS)
1000.0000 mL | Freq: Once | INTRAVENOUS | Status: AC
Start: 2014-10-08 — End: 2014-10-09
  Administered 2014-10-08: 1000 mL via INTRAVENOUS

## 2014-10-08 MED ORDER — MAGNESIUM SULFATE 50 % IJ SOLN
INTRAMUSCULAR | Status: AC
  Administered 2014-10-08: 1 g
  Filled 2014-10-08: qty 2

## 2014-10-08 MED ORDER — MAGNESIUM SULFATE IN D5W 10-5 MG/ML-% IV SOLN
1.0000 g | Freq: Once | INTRAVENOUS | Status: AC
  Administered 2014-10-09: 1 g via INTRAVENOUS
  Filled 2014-10-08: qty 100

## 2014-10-08 MED ORDER — POTASSIUM CHLORIDE 10 MEQ/100ML IV SOLN
10.0000 meq | Freq: Once | INTRAVENOUS | Status: AC
  Administered 2014-10-09: 10 meq via INTRAVENOUS
  Filled 2014-10-08: qty 100

## 2014-10-08 MED ORDER — MAGNESIUM SULFATE 2 GM/50ML IV SOLN
INTRAVENOUS | Status: AC
  Filled 2014-10-08: qty 50

## 2014-10-08 NOTE — ED Notes (Signed)
Pt was playing basketball for 3 hours in the heat and sun.  Pt states that he feels generally weak and not well.

## 2014-10-08 NOTE — ED Provider Notes (Signed)
CSN: [id number]     Arrival date & time 10/08/14  2120 History  This chart was scribed for [name], MD by Budd Palmer, ED Scribe. This patient was seen in room MH10/MH10 and the patient's care was started at 11:24 PM.    Chief Complaint  Patient presents with  . Dehydration   The history is provided by the patient. No language interpreter was used.   HPI Comments: Kyle Martinez is a 44 y.o. male who presents to the Emergency Department complaining of dehydration onset after playing basketball in the heat earlier today. He states he took a nap afterwards and woke up still feeling badly. He notes associated weakness and dizziness. Also reports muscle aches. Pt has a PMHx of low potassium and HTN. He notes that he has recurrent HAs which have stopped since he quit smoking.  Past Medical History  Diagnosis Date  . Hypertension   . High cholesterol    History reviewed. No pertinent past surgical history. No family history on file. History  Substance Use Topics  . Smoking status: Current Some Day Smoker -- 1.00 packs/day    Types: Cigars  . Smokeless tobacco: Not on file  . Alcohol Use: No    Review of Systems  Constitutional: Positive for fatigue. Negative for fever.  Respiratory: Negative.  Negative for chest tightness and shortness of breath.   Cardiovascular: Negative.  Negative for chest pain.  Gastrointestinal: Negative.  Negative for nausea, vomiting and abdominal pain.  Genitourinary: Negative.  Negative for dysuria.  Musculoskeletal: Positive for myalgias. Negative for back pain.  Skin: Negative for rash.  Neurological: Positive for weakness. Negative for headaches.  Psychiatric/Behavioral: Negative for confusion.  All other systems reviewed and are negative.   Allergies  Review of patient's allergies indicates no known allergies.  Home Medications   Prior to Admission medications   Medication Sig Start Date End Date Taking? Authorizing Provider   acetaminophen (TYLENOL) 500 MG tablet Take 1,000 mg by mouth once.    Historical Provider, MD  Calcium-Phosphorus-Vitamin D (CALCIUM GUMMIES PO) Take 3 tablets by mouth daily.      Historical Provider, MD  DM-Phenylephrine-Acetaminophen (TYLENOL COLD MULTI-SYMPTOM) 10-5-325 MG/15ML LIQD Take 30 mLs by mouth once.      Historical Provider, MD  HYDROcodone-acetaminophen (NORCO/VICODIN) 5-325 MG per tablet Take 1-2 tablets by mouth every 6 (six) hours as needed for moderate pain. 04/03/13   Vanetta Mulders, MD  ibuprofen (ADVIL,MOTRIN) 200 MG tablet Take 400 mg by mouth as needed. Pain     Historical Provider, MD  losartan (COZAAR) 50 MG tablet Take 1 tablet (50 mg total) by mouth daily. 05/21/14   Rolland Porter, MD  LOSARTAN POTASSIUM-HCTZ PO Take by mouth.    Historical Provider, MD  naproxen (NAPROSYN) 500 MG tablet Take 1 tablet (500 mg total) by mouth 2 (two) times daily. 04/03/13   Vanetta Mulders, MD  olmesartan-hydrochlorothiazide (BENICAR HCT) 40-25 MG per tablet Take 1 tablet by mouth daily.      Historical Provider, MD  Oseltamivir Phosphate (TAMIFLU PO) Take by mouth.    Historical Provider, MD  oxyCODONE-acetaminophen (PERCOCET) 5-325 MG per tablet Take 1-2 tablets by mouth every 6 (six) hours as needed for severe pain. 07/19/14   Robyn M Hess, PA-C  potassium chloride (K-DUR) 10 MEQ tablet Take 1 tablet (10 mEq total) by mouth 2 (two) times daily. 05/21/14   Rolland Porter, MD  promethazine (PHENERGAN) 25 MG tablet Take 1 tablet (25 mg total) by mouth every  6 (six) hours as needed for nausea or vomiting. 04/03/13   Vanetta Mulders, MD  Pseudoephedrine-APAP-DM (TYLENOL COLD/FLU SEVERE DAY) 60-1000-30 MG/30ML LIQD Take 30 mLs by mouth every 4 (four) hours as needed. For congestion    Historical Provider, MD  Rosuvastatin Calcium (CRESTOR PO) Take 1 tablet by mouth daily.      Historical Provider, MD   BP 150/94 mmHg  Pulse 60  Temp(Src) 98.2 F (36.8 C) (Oral)  Resp 16  Ht 6' (1.829 m)  Wt 215  lb (97.523 kg)  BMI 29.15 kg/m2  SpO2 100% Physical Exam  Constitutional: He is oriented to person, place, and time. He appears well-developed and well-nourished. No distress.  HENT:  Head: Normocephalic and atraumatic.  Cardiovascular: Normal rate, regular rhythm and normal heart sounds.   No murmur heard. Pulmonary/Chest: Effort normal and breath sounds normal. No respiratory distress. He has no wheezes.  Abdominal: Soft. Bowel sounds are normal. There is no tenderness. There is no rebound.  Musculoskeletal: He exhibits no edema.  Neurological: He is alert and oriented to person, place, and time.  Skin: Skin is warm and dry.  Psychiatric: He has a normal mood and affect.  Nursing note and vitals reviewed.   ED Course  Procedures  DIAGNOSTIC STUDIES: Oxygen Saturation is 99% on RA, normal by my interpretation.    COORDINATION OF CARE: 11:27 PM - Discussed plans to order IV fluids. Pt advised of plan for treatment and pt agrees.  Labs Review Labs Reviewed  I-STAT CHEM 8, ED - Abnormal; Notable for the following:    Potassium 3.2 (*)    Chloride 99 (*)    Glucose, Bld 107 (*)    All other components within normal limits  CK    Imaging Review No results found.   EKG Interpretation None      MDM   Final diagnoses:  Heat exhaustion, initial encounter   Patient presents with concerns for dehydration and weakness after playing basketball in the heat. Nontoxic on exam. Afebrile and vital signs are reassuring. Exam is benign. He is not orthostatic. Chem-8 is reassuring. He does have mild hypokalemia. Patient was given potassium and magnesium. Patient was given 1 L of fluids and CK is pending.  12:58 AM   Patient continues to complain of generalized fatigue. Second liter of fluids ordered.  Patient reports improvement. CK is normal.   After history, exam, and medical workup I feel the patient has been appropriately medically screened and is safe for discharge home.  Pertinent diagnoses were discussed with the patient. Patient was given return precautions.  I personally performed the services described in this documentation, which was scribed in my presence. The recorded information has been reviewed and is accurate.   [name], MD 10/09/14 [phone]

## 2014-10-09 LAB — CK: Total CK: 381 U/L (ref 49–397)

## 2014-10-09 MED ORDER — SODIUM CHLORIDE 0.9 % IV BOLUS (SEPSIS)
1000.0000 mL | Freq: Once | INTRAVENOUS | Status: AC
Start: 2014-10-09 — End: 2014-10-09
  Administered 2014-10-09: 1000 mL via INTRAVENOUS

## 2014-10-09 NOTE — Discharge Instructions (Signed)
Heat Illness °If the body is unable maintain a proper body temperature in hot and/or humid conditions during physical activity, severe illness may occur. To maintain a relatively constant body temperature, the body radiates heat out to the environment and evaporates sweat. In very hot and/or humid conditions, these two methods of heat loss may not work properly. In order to perform physically in such a climate, the body must have time to acclimate (make physiological changes to compensate), such as increase the rate of sweating. When performing physical activity in hot and/or humid environments, it is important to stay hydrated. Adequate hydration will help the body sweat properly. If the body temperature is allowed to increase too much, a person's judgment and performance will decline. °SYMPTOMS  °· Dizziness. °· Fatigue. °· Changes in judgment. °· Muscle cramps. °· Weakness. °· Nausea and vomiting. °· Rapid heart rate. °· Fainting. °· Death. °· Diarrhea. °· Seizures. °· Liver failure. °· Kidney failure. °· Low blood pressure. °· Loss of consciousness (coma). °· Elevated body temperature. °CAUSES  °· Hot and/or humid conditions. °· Poor conditioning. °· Not being acclimated to the heat. °· Dehydration. °· Obesity. °· Inappropriate clothing (does not allow water to evaporate). °· Age (very old and very young people). °· Medications: diuretics, caffeine, decongestants, stimulants, some blood pressure medications. °RISK INCREASES WITH: °· Older age (decreased body water, decreased blood supply to skin, resulting in decreased sweating, decreased sweat rate). °· Young boys (decreased sweat rate compared with men). °· Dehydration. °· Not being acclimated to the heat (this takes 1 to 2 hours per day for a minimum of 6 days). °· Waiting until thirsty to drink. °· Use of stimulants. Amphetamines, cocaine, or decongestants increase risk for heat illness. °· Use of diuretics (increases urination). °· Use of medicines with  anticholinergic properties. °· Use of medicines that slow the heart rate. °PREVENTION  °· Maintain needed hydration before, during, and after exercise. °· Wear clothing that allows sweat to evaporate (light colored, lightweight, breathable). °· Take the time to acclimate to the heat. °· Avoid salt tablets (they irritate the stomach). °· Monitor weight after practices. °· Avoid physical activity during the hottest times of day. °PROGNOSIS  °Most athletes who suffer from heat illness will recover completely, if treated. Severe heat illness is a medical emergency and may require hospitalization. °RELATED COMPLICATIONS  °· Rhabdomyolysis (death of muscle, resulting in weakness and pain). °· Acute respiratory distress syndrome (lining of the lung is altered to prevent oxygen from getting into the bloodstream, which can result in death). °· Disseminated intravascular coagulation (spontaneous clotting of the blood, resulting in an inability to make normal blood clots when needed). °· Kidney failure. °· Liver failure. °· Seizures (abnormal electrical activity in the brain). °· Death. °TREATMENT °The most important treatment is to remove affected persons from the heat. Give the patient cool water to drink. For severe cases of heat illness, it may be necessary to cool the patient more aggressively, such as with an ice bath or cold shower. If symptoms persist after treatment, seek medical attention. °MEDICATION  °· Oxygen is used in severe cases, if there is lung damage. °· Fluid injections may be given for hydration. °ACTIVITY  °· A patient may return to sport as soon as he or she is able. °· Heat illness may make an athlete vulnerable to future episodes of heat illness. °· Allow the body to acclimate before performing in hot and/or humid conditions. °DIET  °Drink 8 oz. of fluid before exercise   and 4 oz. of fluid every 15 to 20 minutes during exercise. As an alternative, try to drink about 1 quart of fluid for every hour of  exercise. SEEK MEDICAL CARE IF:   You develop vomiting or diarrhea after exercising in the heat.  Someone collapses while exercising in the heat.  You have increasing problems exercising in the heat.  You notice increased muscle aches after exercising in the heat.  There is a change in the color of your urine after exercise. Document Released: 02/24/2005 Document Revised: 05/19/2011 Document Reviewed: 06/08/2008 [employer] Patient Information 2015 [employer]. This information is not intended to replace advice given to you by your health care provider. Make sure you discuss any questions you have with your health care provider.  

## 2015-05-26 ENCOUNTER — Encounter (HOSPITAL_BASED_OUTPATIENT_CLINIC_OR_DEPARTMENT_OTHER): Payer: Self-pay | Admitting: *Deleted

## 2015-05-26 ENCOUNTER — Emergency Department (HOSPITAL_BASED_OUTPATIENT_CLINIC_OR_DEPARTMENT_OTHER)
Admission: EM | Admit: 2015-05-26 | Discharge: 2015-05-26 | Disposition: A | Discharge status: Home / Self Care | Payer: BLUE CROSS/BLUE SHIELD | Attending: Emergency Medicine | Admitting: Emergency Medicine

## 2015-05-26 DIAGNOSIS — R5383 Other fatigue: Secondary | ICD-10-CM

## 2015-05-26 DIAGNOSIS — Z87891 Personal history of nicotine dependence: Secondary | ICD-10-CM | POA: Insufficient documentation

## 2015-05-26 DIAGNOSIS — Z791 Long term (current) use of non-steroidal anti-inflammatories (NSAID): Secondary | ICD-10-CM | POA: Insufficient documentation

## 2015-05-26 DIAGNOSIS — I10 Essential (primary) hypertension: Secondary | ICD-10-CM | POA: Insufficient documentation

## 2015-05-26 DIAGNOSIS — E7800 Pure hypercholesterolemia, unspecified: Secondary | ICD-10-CM | POA: Diagnosis not present

## 2015-05-26 DIAGNOSIS — E876 Hypokalemia: Secondary | ICD-10-CM

## 2015-05-26 DIAGNOSIS — R531 Weakness: Secondary | ICD-10-CM | POA: Diagnosis present

## 2015-05-26 DIAGNOSIS — Z79899 Other long term (current) drug therapy: Secondary | ICD-10-CM | POA: Diagnosis not present

## 2015-05-26 LAB — BASIC METABOLIC PANEL
Anion gap: 10 (ref 5–15)
BUN: 13 mg/dL (ref 6–20)
CHLORIDE: 102 mmol/L (ref 101–111)
CO2: 26 mmol/L (ref 22–32)
Calcium: 8.9 mg/dL (ref 8.9–10.3)
Creatinine, Ser: 0.9 mg/dL (ref 0.61–1.24)
GFR calc non Af Amer: >60 mL/min (ref >60)
Glucose, Bld: 100 mg/dL — ABNORMAL HIGH (ref 65–99)
Potassium: 3.2 mmol/L — ABNORMAL LOW (ref 3.5–5.1)
Sodium: 138 mmol/L (ref 135–145)

## 2015-05-26 LAB — MAGNESIUM: Magnesium: 2.1 mg/dL (ref 1.7–2.4)

## 2015-05-26 MED ORDER — POTASSIUM CHLORIDE CRYS ER 20 MEQ PO TBCR
40.0000 meq | EXTENDED_RELEASE_TABLET | Freq: Once | ORAL | Status: AC
  Administered 2015-05-26: 40 meq via ORAL
  Filled 2015-05-26: qty 2

## 2015-05-26 MED ORDER — POTASSIUM CHLORIDE CRYS ER 20 MEQ PO TBCR: 20.0000 meq | EXTENDED_RELEASE_TABLET | Freq: Two times a day (BID) | ORAL | Status: AC

## 2015-05-26 NOTE — Discharge Instructions (Signed)
Fatigue  Fatigue is feeling tired all of the time, a lack of energy, or a lack of motivation. Occasional or mild fatigue is often a normal response to activity or life in general. However, long-lasting (chronic) or extreme fatigue may indicate an underlying medical condition.  HOME CARE INSTRUCTIONS   Watch your fatigue for any changes. The following actions may help to lessen any discomfort you are feeling:  · Talk to your health care provider about how much sleep you need each night. Try to get the required amount every night.  · Take medicines only as directed by your health care provider.  · Eat a healthy and nutritious diet. Ask your health care provider if you need help changing your diet.  · Drink enough fluid to keep your urine clear or pale yellow.  · Practice ways of relaxing, such as yoga, meditation, massage therapy, or acupuncture.  · Exercise regularly.    · Change situations that cause you stress. Try to keep your work and personal routine reasonable.  · Do not abuse illegal drugs.  · Limit alcohol intake to no more than 1 drink per day for nonpregnant women and 2 drinks per day for men. One drink equals 12 ounces of beer, 5 ounces of wine, or 1½ ounces of hard liquor.  · Take a multivitamin, if directed by your health care provider.  SEEK MEDICAL CARE IF:   · Your fatigue does not get better.  · You have a fever.    · You have unintentional weight loss or gain.  · You have headaches.    · You have difficulty:      Falling asleep.    Sleeping throughout the night.  · You feel angry, guilty, anxious, or sad.     · You are unable to have a bowel movement (constipation).    · You skin is dry.     · Your legs or another part of your body is swollen.    SEEK IMMEDIATE MEDICAL CARE IF:   · You feel confused.    · Your vision is blurry.  · You feel faint or pass out.    · You have a severe headache.    · You have severe abdominal, pelvic, or back pain.    · You have chest pain, shortness of breath, or an  irregular or fast heartbeat.    · You are unable to urinate or you urinate less than normal.    · You develop abnormal bleeding, such as bleeding from the rectum, vagina, nose, lungs, or nipples.  · You vomit blood.     · You have thoughts about harming yourself or committing suicide.    · You are worried that you might harm someone else.       This information is not intended to replace advice given to you by your health care provider. Make sure you discuss any questions you have with your health care provider.     Document Released: 12/22/2006 Document Revised: 03/17/2014 Document Reviewed: 06/28/2013  Elsevier Interactive Patient Education ©2016 [employer]

## 2015-05-26 NOTE — ED Notes (Signed)
Pt reports that he does not feel well has a hx of hypokalemia feels similar to previous episode intermittent muscle "tiredness" alert and oriented x 4

## 2015-05-26 NOTE — ED Provider Notes (Signed)
CSN: [id number]     Arrival date & time 05/26/15  0017 History   First MD Initiated Contact with Patient 05/26/15 0050     Chief Complaint  Patient presents with  . Weakness     (Consider location/radiation/quality/duration/timing/severity/associated sxs/prior Treatment) Patient is a 45 y.o. male presenting with weakness.  Weakness This is a recurrent problem. The current episode started 12 to 24 hours ago. The problem occurs constantly. The problem has not changed since onset.Pertinent negatives include no chest pain, no abdominal pain, no headaches and no shortness of breath. Nothing aggravates the symptoms. Nothing relieves the symptoms. He has tried nothing for the symptoms. The treatment provided no relief.    Past Medical History  Diagnosis Date  . Hypertension   . High cholesterol    History reviewed. No pertinent past surgical history. History reviewed. No pertinent family history. Social History  Substance Use Topics  . Smoking status: Former Smoker -- 1.00 packs/day    Types: Cigars  . Smokeless tobacco: None  . Alcohol Use: No    Review of Systems  Constitutional: Positive for fatigue. Negative for fever, chills and diaphoresis.  Respiratory: Negative for shortness of breath.   Cardiovascular: Negative for chest pain, palpitations and leg swelling.  Gastrointestinal: Negative for abdominal pain.  Neurological: Negative for numbness and headaches.  All other systems reviewed and are negative.     Allergies  Review of patient's allergies indicates no known allergies.  Home Medications   Prior to Admission medications   Medication Sig Start Date End Date Taking? Authorizing Provider  acetaminophen (TYLENOL) 500 MG tablet Take 1,000 mg by mouth once.    Historical Provider, MD  Calcium-Phosphorus-Vitamin D (CALCIUM GUMMIES PO) Take 3 tablets by mouth daily.      Historical Provider, MD  DM-Phenylephrine-Acetaminophen (TYLENOL COLD MULTI-SYMPTOM) 10-5-325  MG/15ML LIQD Take 30 mLs by mouth once.      Historical Provider, MD  HYDROcodone-acetaminophen (NORCO/VICODIN) 5-325 MG per tablet Take 1-2 tablets by mouth every 6 (six) hours as needed for moderate pain. 04/03/13   Vanetta Mulders, MD  ibuprofen (ADVIL,MOTRIN) 200 MG tablet Take 400 mg by mouth as needed. Pain     Historical Provider, MD  losartan (COZAAR) 50 MG tablet Take 1 tablet (50 mg total) by mouth daily. 05/21/14   Rolland Porter, MD  LOSARTAN POTASSIUM-HCTZ PO Take by mouth.    Historical Provider, MD  naproxen (NAPROSYN) 500 MG tablet Take 1 tablet (500 mg total) by mouth 2 (two) times daily. 04/03/13   Vanetta Mulders, MD  olmesartan-hydrochlorothiazide (BENICAR HCT) 40-25 MG per tablet Take 1 tablet by mouth daily.      Historical Provider, MD  Oseltamivir Phosphate (TAMIFLU PO) Take by mouth.    Historical Provider, MD  oxyCODONE-acetaminophen (PERCOCET) 5-325 MG per tablet Take 1-2 tablets by mouth every 6 (six) hours as needed for severe pain. 07/19/14   Robyn M Hess, PA-C  potassium chloride (K-DUR) 10 MEQ tablet Take 1 tablet (10 mEq total) by mouth 2 (two) times daily. 05/21/14   Rolland Porter, MD  promethazine (PHENERGAN) 25 MG tablet Take 1 tablet (25 mg total) by mouth every 6 (six) hours as needed for nausea or vomiting. 04/03/13   Vanetta Mulders, MD  Pseudoephedrine-APAP-DM (TYLENOL COLD/FLU SEVERE DAY) 60-1000-30 MG/30ML LIQD Take 30 mLs by mouth every 4 (four) hours as needed. For congestion    Historical Provider, MD  Rosuvastatin Calcium (CRESTOR PO) Take 1 tablet by mouth daily.      Historical  Provider, MD   BP 155/100 mmHg  Pulse 58  Temp(Src) 98.2 F (36.8 C) (Oral)  Resp 16  Ht 6\' 1"  (1.854 m)  Wt 228 lb (103.42 kg)  BMI 30.09 kg/m2  SpO2 99% Physical Exam  Constitutional: He is oriented to person, place, and time. He appears well-developed and well-nourished. No distress.  HENT:  Head: Normocephalic and atraumatic.  Mouth/Throat: Oropharynx is clear and moist.   Eyes: Conjunctivae are normal. Pupils are equal, round, and reactive to light.  Neck: Normal range of motion. Neck supple.  Cardiovascular: Normal rate, regular rhythm and intact distal pulses.   Pulmonary/Chest: Effort normal and breath sounds normal. No respiratory distress. He has no wheezes. He has no rales.  Abdominal: Soft. Bowel sounds are normal. There is no tenderness. There is no rebound and no guarding.  Musculoskeletal: Normal range of motion.  Neurological: He is alert and oriented to person, place, and time.  Skin: Skin is warm and dry.  Psychiatric: He has a normal mood and affect.    ED Course  Procedures (including critical care time) Labs Review Labs Reviewed  BASIC METABOLIC PANEL  MAGNESIUM    Imaging Review No results found. I have personally reviewed and evaluated these images and lab results as part of my medical decision-making.   EKG Interpretation None      MDM   Final diagnoses:  None    Magnesium is normal.  Potassium is only slightly low will treat with 40 meQ in the ED and give RX for potassium.  Strict return precautions given follow up with your PMD    Gracin Mcpartland, MD 05/26/15 0121

## 2015-06-02 ENCOUNTER — Encounter (HOSPITAL_BASED_OUTPATIENT_CLINIC_OR_DEPARTMENT_OTHER): Payer: Self-pay | Admitting: Emergency Medicine

## 2015-06-02 ENCOUNTER — Emergency Department (HOSPITAL_BASED_OUTPATIENT_CLINIC_OR_DEPARTMENT_OTHER)
Admission: EM | Admit: 2015-06-02 | Discharge: 2015-06-02 | Disposition: A | Discharge status: Home / Self Care | Payer: BLUE CROSS/BLUE SHIELD | Attending: Emergency Medicine | Admitting: Emergency Medicine

## 2015-06-02 DIAGNOSIS — R52 Pain, unspecified: Secondary | ICD-10-CM | POA: Diagnosis present

## 2015-06-02 DIAGNOSIS — I10 Essential (primary) hypertension: Secondary | ICD-10-CM | POA: Diagnosis not present

## 2015-06-02 NOTE — ED Notes (Addendum)
Patient states that he was here about 1 week ago and was given a rx for K because his potassium was low. The patient reports that he is "not no better"  - the patient reports that his muscles feel very fatigued like he has stood on them for 12 hours.

## 2015-06-02 NOTE — ED Notes (Signed)
Patient chose to leave. Told registration that they were leaving.

## 2016-11-08 IMAGING — DX DG SHOULDER 2+V*R*
1 series · 1 of 1 positions shown · non-contrast
Comparison: None.

CLINICAL DATA: Shoulder injury with pain

EXAM:
RIGHT SHOULDER - 2+ VIEW

[shoulder grashey]
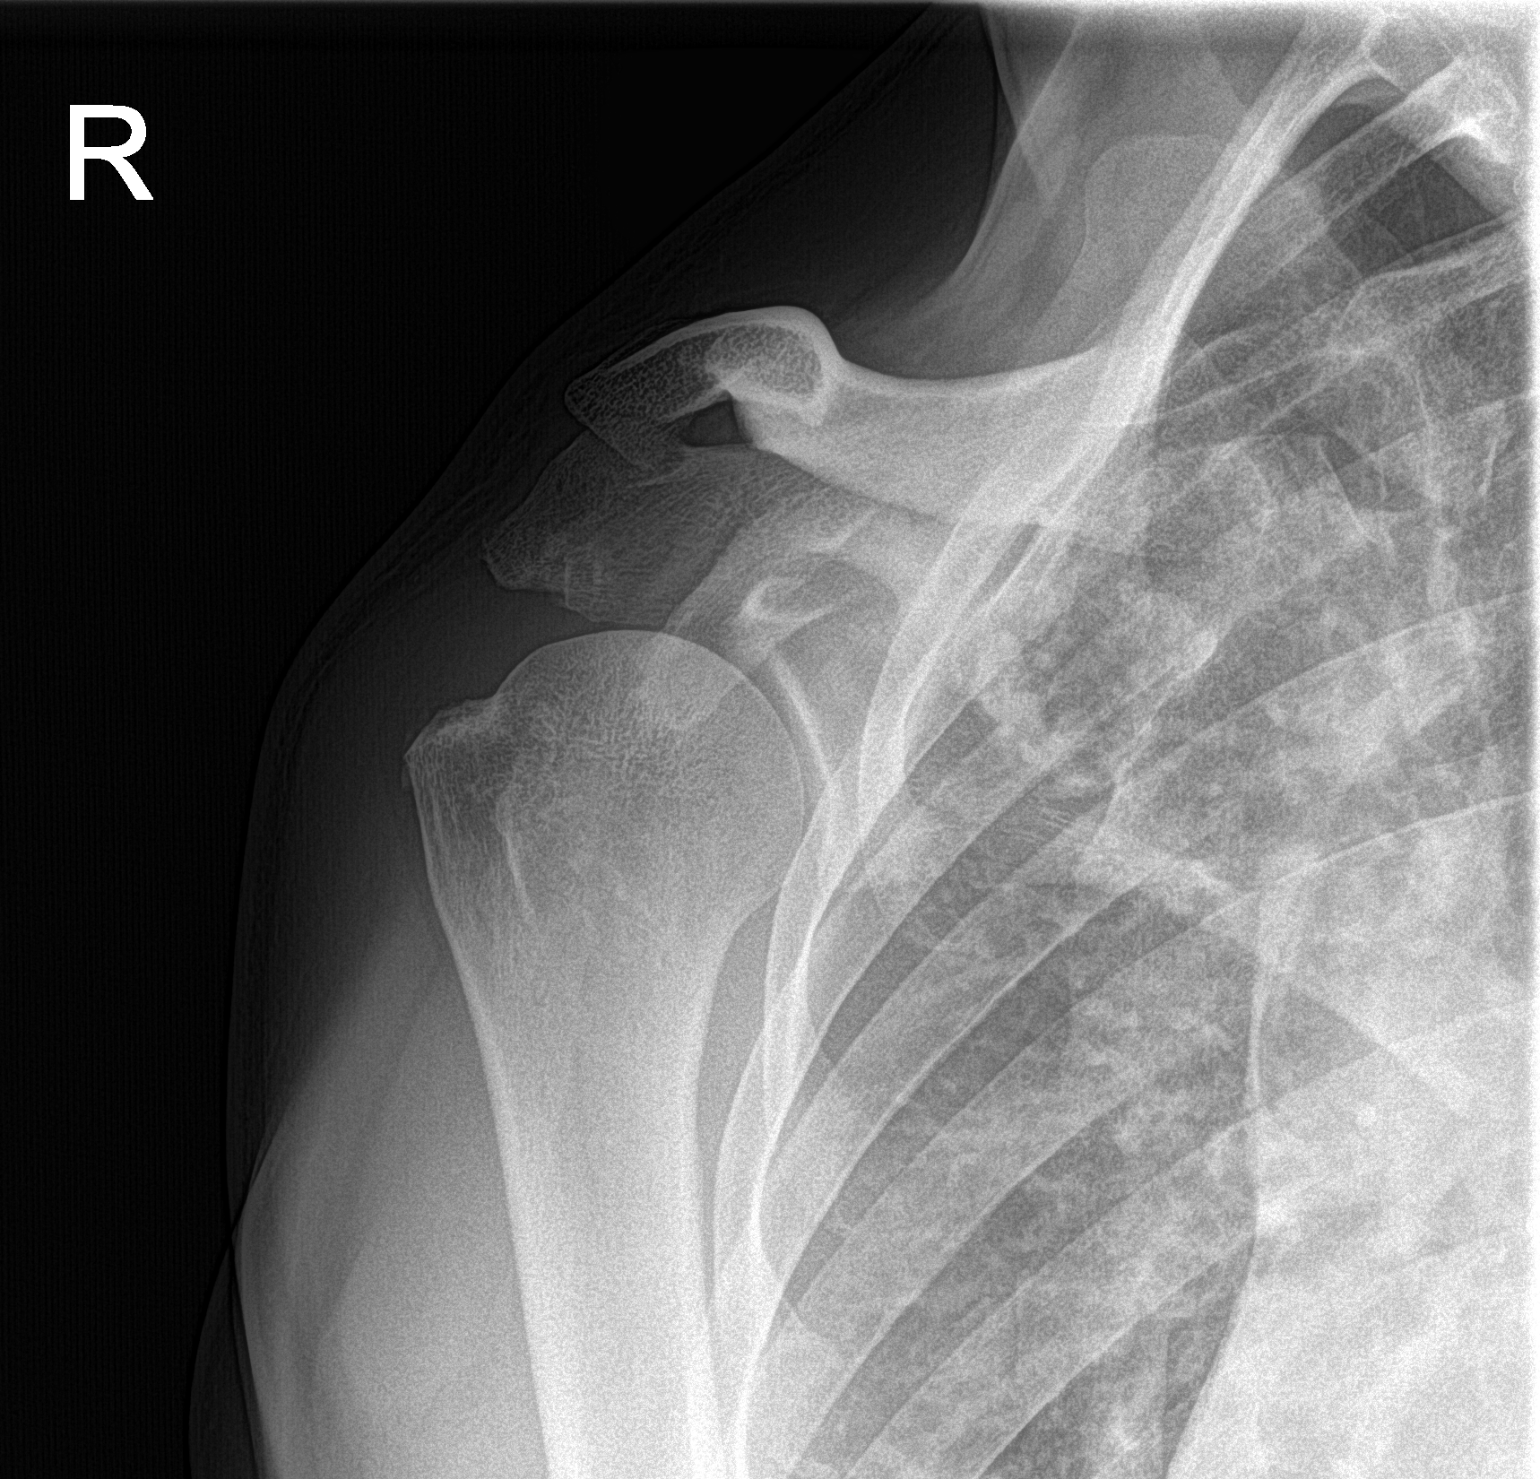

[1 of 1 positions shown; findings below may reference images not displayed]

FINDINGS: Normal alignment.  No fracture.

Downsloping acromion. Soft tissue calcification lateral to the
greater tuberosity consistent with calcific tendinitis. Glenohumeral
joint normal.
IMPRESSION: Calcific tendinitis with anatomy favorable to rotator cuff
impingement.

## 2017-03-13 ENCOUNTER — Other Ambulatory Visit: Payer: Self-pay

## 2017-03-13 ENCOUNTER — Encounter (HOSPITAL_BASED_OUTPATIENT_CLINIC_OR_DEPARTMENT_OTHER): Payer: Self-pay | Admitting: Emergency Medicine

## 2017-03-13 ENCOUNTER — Emergency Department (HOSPITAL_BASED_OUTPATIENT_CLINIC_OR_DEPARTMENT_OTHER)
Admission: EM | Admit: 2017-03-13 | Discharge: 2017-03-13 | Disposition: A | Discharge status: Home / Self Care | Payer: BLUE CROSS/BLUE SHIELD | Attending: Emergency Medicine | Admitting: Emergency Medicine

## 2017-03-13 DIAGNOSIS — Z87891 Personal history of nicotine dependence: Secondary | ICD-10-CM | POA: Diagnosis not present

## 2017-03-13 DIAGNOSIS — E860 Dehydration: Secondary | ICD-10-CM | POA: Insufficient documentation

## 2017-03-13 DIAGNOSIS — E876 Hypokalemia: Secondary | ICD-10-CM

## 2017-03-13 DIAGNOSIS — I10 Essential (primary) hypertension: Secondary | ICD-10-CM | POA: Insufficient documentation

## 2017-03-13 DIAGNOSIS — R5383 Other fatigue: Secondary | ICD-10-CM | POA: Diagnosis present

## 2017-03-13 HISTORY — DX: Hypokalemia: E87.6

## 2017-03-13 LAB — CBC WITH DIFFERENTIAL/PLATELET
BASOS ABS: 0 10*3/uL (ref 0.0–0.1)
Basophils Relative: 0 %
EOS PCT: 2 %
Eosinophils Absolute: 0.1 10*3/uL (ref 0.0–0.7)
HCT: 42 % (ref 39.0–52.0)
Hemoglobin: 14.6 g/dL (ref 13.0–17.0)
LYMPHS ABS: 2.7 10*3/uL (ref 0.7–4.0)
LYMPHS PCT: 57 %
MCH: 29.1 pg (ref 26.0–34.0)
MCHC: 34.8 g/dL (ref 30.0–36.0)
MCV: 83.8 fL (ref 78.0–100.0)
MONO ABS: 0.5 10*3/uL (ref 0.1–1.0)
Monocytes Relative: 10 %
Neutro Abs: 1.5 10*3/uL — ABNORMAL LOW (ref 1.7–7.7)
Neutrophils Relative %: 31 %
PLATELETS: 220 10*3/uL (ref 150–400)
RBC: 5.01 MIL/uL (ref 4.22–5.81)
RDW: 12.3 % (ref 11.5–15.5)
WBC: 4.8 10*3/uL (ref 4.0–10.5)

## 2017-03-13 LAB — BASIC METABOLIC PANEL
Anion gap: 9 (ref 5–15)
BUN: 15 mg/dL (ref 6–20)
CO2: 28 mmol/L (ref 22–32)
CREATININE: 0.98 mg/dL (ref 0.61–1.24)
Calcium: 9 mg/dL (ref 8.9–10.3)
Chloride: 99 mmol/L — ABNORMAL LOW (ref 101–111)
GFR calc Af Amer: >60 mL/min (ref >60)
Glucose, Bld: 134 mg/dL — ABNORMAL HIGH (ref 65–99)
Potassium: 3 mmol/L — ABNORMAL LOW (ref 3.5–5.1)
Sodium: 136 mmol/L (ref 135–145)

## 2017-03-13 MED ORDER — POTASSIUM CHLORIDE CRYS ER 20 MEQ PO TBCR: 20.0000 meq | EXTENDED_RELEASE_TABLET | Freq: Two times a day (BID) | ORAL | 0 refills | Status: AC

## 2017-03-13 MED ORDER — SODIUM CHLORIDE 0.9 % IV BOLUS (SEPSIS)
1000.0000 mL | Freq: Once | INTRAVENOUS | Status: AC
  Administered 2017-03-13: 1000 mL via INTRAVENOUS

## 2017-03-13 MED ORDER — POTASSIUM CHLORIDE 10 MEQ/100ML IV SOLN
10.0000 meq | Freq: Once | INTRAVENOUS | Status: AC
  Administered 2017-03-13: 10 meq via INTRAVENOUS
  Filled 2017-03-13: qty 100

## 2017-03-13 MED FILL — POTASSIUM CL ER 20 MEQ TABL: 20 | 5 days supply | Qty: 10 | Fill #0

## 2017-03-13 NOTE — ED Triage Notes (Signed)
Pt states his "muscles feel tired". Denies n/v/d and fever. Pt states he "feels extremely tired".

## 2017-03-13 NOTE — Discharge Instructions (Signed)
You were seen in the ED today with low potassium which is likely contributing to your fatigue. Take the 40 mEQ tabs twice a day for the next 5 days and then you can return to taking your other supplements as directed. You should establish care with a PCP, if you haven't already, so that they can re-check your potassium labs in the coming 1-2 weeks. Return to the ED with any weakness on one side, fever, chills, or other concerning symptoms.

## 2017-03-13 NOTE — ED Triage Notes (Signed)
Pt reports having low potassium previously and experienced similar symptoms.

## 2017-03-13 NOTE — ED Provider Notes (Signed)
Emergency Department Provider Note   I have reviewed the triage vital signs and the nursing notes.   HISTORY  Chief Complaint Fatigue   HPI Kyle Martinez is a 47 y.o. male with PMH of HLD, HTN, and Hypokalemia since to the emergency department for evaluation of sudden onset generalized weakness and fatigue.  The patient has had episodes of low potassium in the past and experienced similar symptoms.  States over the past 2 days he has been feeling more tired with typical daily tasks.  He denies any obvious bleeding.  No episodes of diarrhea or vomiting.  Patient is not experiencing any pain or palpitations.  No modifying factors.  No radiation of symptoms.  No fevers or chills.  No muscle cramping. No changes to medication.    Past Medical History:  Diagnosis Date  . High cholesterol   . Hypertension   . Hypokalemia     There are no active problems to display for this patient.   History reviewed. No pertinent surgical history.  Current Outpatient Rx  . Order #: [id number] Class: Historical Med  . Order #: [id number] Class: Historical Med  . Order #: [id number] Class: Historical Med  . Order #: [id number] Class: Print  . Order #: [id number] Class: Historical Med  . Order #: [id number] Class: Print  . Order #: [id number] Class: Historical Med  . Order #: [id number] Class: Print  . Order #: [id number] Class: Historical Med  . Order #: [id number] Class: Historical Med  . Order #: [id number] Class: Print  . Order #: [id number] Class: Print  . Order #: [id number] Class: Print  . Order #: [id number] Class: Print  . Order #: [id number] Class: Print  . Order #: [id number] Class: Historical Med  . Order #: [id number] Class: Historical Med    Allergies Patient has no known allergies.  No family history on file.  Social History Social History   Tobacco Use  . Smoking status: Former Smoker    Packs/day: 1.00    Types: Cigars  . Smokeless tobacco: Never Used  Substance Use Topics  . Alcohol use: No  .  Drug use: Yes    Types: Marijuana    Review of Systems  Constitutional: No fever/chills. Positive generalized fatigue.  Eyes: No visual changes. ENT: No sore throat. Cardiovascular: Denies chest pain. Respiratory: Denies shortness of breath. Gastrointestinal: No abdominal pain.  No nausea, no vomiting.  No diarrhea.  No constipation. Genitourinary: Negative for dysuria. Musculoskeletal: Negative for back pain. Skin: Negative for rash. Neurological: Negative for headaches, focal weakness or numbness.  10-point ROS otherwise negative.  ____________________________________________   PHYSICAL EXAM:  VITAL SIGNS: ED Triage Vitals  Enc Vitals Group     BP 03/13/17 0646 137/87     Pulse Rate 03/13/17 0646 (!) 57     Resp 03/13/17 0646 18     Temp 03/13/17 0646 98.2 F (36.8 C)     Temp Source 03/13/17 0646 Oral     SpO2 03/13/17 0646 99 %     Weight 03/13/17 0645 241 lb (109.3 kg)     Height 03/13/17 0645 6' (1.829 m)   Constitutional: Alert and oriented. Well appearing and in no acute distress. Eyes: Conjunctivae are normal.  Head: Atraumatic. Nose: No congestion/rhinnorhea. Mouth/Throat: Mucous membranes are moist.  Oropharynx non-erythematous. Neck: No stridor.  Cardiovascular: Normal rate, regular rhythm. Good peripheral circulation. Grossly normal heart sounds.   Respiratory: Normal respiratory effort.  No retractions. Lungs CTAB. Gastrointestinal: Soft and nontender. No distention.  Musculoskeletal: No lower extremity tenderness nor edema. No gross deformities of extremities. Neurologic:  Normal speech and language. No gross focal neurologic deficits are appreciated.  Skin:  Skin is warm, dry and intact. No rash noted.   ____________________________________________   LABS (all labs ordered are listed, but only abnormal results are displayed)  Labs Reviewed  BASIC METABOLIC PANEL - Abnormal; Notable for the following components:      Result Value   Potassium  3.0 (*)    Chloride 99 (*)    Glucose, Bld 134 (*)    All other components within normal limits  CBC WITH DIFFERENTIAL/PLATELET - Abnormal; Notable for the following components:   Neutro Abs 1.5 (*)    All other components within normal limits   ____________________________________________  EKG   EKG Interpretation  Date/Time:  Friday March 13 2017 07:11:15 EST Ventricular Rate:  55 PR Interval:    QRS Duration: 102 QT Interval:  455 QTC Calculation: 436 R Axis:   71 Text Interpretation:  Sinus rhythm Prolonged PR interval No STEMI. Similar to prior.  Confirmed by Alona Bene [phone]) on 03/13/2017 10:41:25 AM       ____________________________________________  RADIOLOGY  None ____________________________________________   PROCEDURES  Procedure(s) performed:   Procedures  None ____________________________________________   INITIAL IMPRESSION / ASSESSMENT AND PLAN / ED COURSE  Pertinent labs & imaging results that were available during my care of the patient were reviewed by me and considered in my medical decision making (see chart for details).  Patient presents to the emergency department with generalized fatigue.  No history to suggest acute anemia.  Patient has no focal neurological deficits and describes the weakness as generalized.  No evidence of stroke.  Patient has had low potassium in the past.  Reviewed the patient's medications.  Plan for labs, IV fluids, EKG, reassess.  08:48 AM Patient with mild hypokalemia here.  He has not been taking his potassium supplements at home the way that he should.  Patient is requesting additional IV fluids and potassium and states this is helped him in the past.  I told him that IV potassium changes his levels very little and he get additional fluids and take oral potassium supplements but he insists that this is helped him before.  Plan for a single 10 mEq potassium supplementation IV with additional fluids.   10:41  AM  sent feeling better after IV fluids and single run of 10 mEq IV potassium.  Plan to increase his potassium to 40 twice daily for the next 5 days and then he can return to his normal potassium supplementation dosing.  Advised that he needs to follow with his primary care physician for potassium recheck in the next 1-2 weeks.   At this time, I do not feel there is any life-threatening condition present. I have reviewed and discussed all results (EKG, imaging, lab, urine as appropriate), exam findings with patient. I have reviewed nursing notes and appropriate previous records.  I feel the patient is safe to be discharged home without further emergent workup. Discussed usual and customary return precautions. Patient and family (if present) verbalize understanding and are comfortable with this plan.  Patient will follow-up with their primary care provider. If they do not have a primary care provider, information for follow-up has been provided to them. All questions have been answered.  ____________________________________________  FINAL CLINICAL IMPRESSION(S) / ED DIAGNOSES  Final diagnoses:  Hypokalemia  Dehydration     MEDICATIONS GIVEN DURING THIS VISIT:  Medications  sodium chloride 0.9 % bolus 1,000 mL (0 mLs Intravenous Stopped 03/13/17  [id number])  potassium chloride 10 mEq in 100 mL IVPB (0 mEq Intravenous Stopped 03/13/17 1011)  sodium chloride 0.9 % bolus 1,000 mL (0 mLs Intravenous Stopped 03/13/17 1040)    Note:  This document was prepared using Dragon voice recognition software and may include unintentional dictation errors.  Alona BeneJoshua Lexus Shampine, MD Emergency Medicine    Calloway Andrus, Arlyss RepressJoshua G, MD 03/13/17 [phone]

## 2017-04-17 ENCOUNTER — Emergency Department (HOSPITAL_BASED_OUTPATIENT_CLINIC_OR_DEPARTMENT_OTHER)
Admission: EM | Admit: 2017-04-17 | Discharge: 2017-04-17 | Discharge status: Against Medical Advice | Payer: BLUE CROSS/BLUE SHIELD

## 2021-10-22 ENCOUNTER — Emergency Department (HOSPITAL_BASED_OUTPATIENT_CLINIC_OR_DEPARTMENT_OTHER)
Admission: EM | Admit: 2021-10-22 | Discharge: 2021-10-22 | Disposition: A | Discharge status: Home / Self Care | Payer: BLUE CROSS/BLUE SHIELD | Attending: Emergency Medicine | Admitting: Emergency Medicine

## 2021-10-22 ENCOUNTER — Encounter (HOSPITAL_BASED_OUTPATIENT_CLINIC_OR_DEPARTMENT_OTHER): Payer: Self-pay | Admitting: Urology

## 2021-10-22 ENCOUNTER — Other Ambulatory Visit: Payer: Self-pay

## 2021-10-22 DIAGNOSIS — R42 Dizziness and giddiness: Secondary | ICD-10-CM

## 2021-10-22 DIAGNOSIS — R739 Hyperglycemia, unspecified: Secondary | ICD-10-CM | POA: Insufficient documentation

## 2021-10-22 DIAGNOSIS — M79606 Pain in leg, unspecified: Secondary | ICD-10-CM | POA: Insufficient documentation

## 2021-10-22 DIAGNOSIS — E860 Dehydration: Secondary | ICD-10-CM | POA: Insufficient documentation

## 2021-10-22 DIAGNOSIS — Z20822 Contact with and (suspected) exposure to covid-19: Secondary | ICD-10-CM | POA: Insufficient documentation

## 2021-10-22 DIAGNOSIS — M791 Myalgia, unspecified site: Secondary | ICD-10-CM | POA: Insufficient documentation

## 2021-10-22 DIAGNOSIS — Z79899 Other long term (current) drug therapy: Secondary | ICD-10-CM | POA: Insufficient documentation

## 2021-10-22 DIAGNOSIS — I10 Essential (primary) hypertension: Secondary | ICD-10-CM | POA: Insufficient documentation

## 2021-10-22 DIAGNOSIS — M25519 Pain in unspecified shoulder: Secondary | ICD-10-CM | POA: Insufficient documentation

## 2021-10-22 LAB — CBC
HCT: 44.9 % (ref 39.0–52.0)
Hemoglobin: 15.2 g/dL (ref 13.0–17.0)
MCH: 29.7 pg (ref 26.0–34.0)
MCHC: 33.9 g/dL (ref 30.0–36.0)
MCV: 87.7 fL (ref 80.0–100.0)
Platelets: 248 10*3/uL (ref 150–400)
RBC: 5.12 MIL/uL (ref 4.22–5.81)
RDW: 12 % (ref 11.5–15.5)
WBC: 4.8 10*3/uL (ref 4.0–10.5)
nRBC: 0 % (ref 0.0–0.2)

## 2021-10-22 LAB — BASIC METABOLIC PANEL
Anion gap: 8 (ref 5–15)
BUN: 18 mg/dL (ref 6–20)
CO2: 30 mmol/L (ref 22–32)
Calcium: 9.5 mg/dL (ref 8.9–10.3)
Chloride: 97 mmol/L — ABNORMAL LOW (ref 98–111)
Creatinine, Ser: 1.15 mg/dL (ref 0.61–1.24)
GFR, Estimated: >60 mL/min (ref >60)
Glucose, Bld: 122 mg/dL — ABNORMAL HIGH (ref 70–99)
Potassium: 4 mmol/L (ref 3.5–5.1)
Sodium: 135 mmol/L (ref 135–145)

## 2021-10-22 LAB — URINALYSIS, ROUTINE W REFLEX MICROSCOPIC
Bilirubin Urine: NEGATIVE
Glucose, UA: NEGATIVE mg/dL
Hgb urine dipstick: NEGATIVE
Ketones, ur: NEGATIVE mg/dL
Leukocytes,Ua: NEGATIVE
Nitrite: NEGATIVE
Protein, ur: NEGATIVE mg/dL
Specific Gravity, Urine: 1.015 (ref 1.005–1.030)
pH: 5.5 (ref 5.0–8.0)

## 2021-10-22 LAB — SARS CORONAVIRUS 2 BY RT PCR: SARS Coronavirus 2 by RT PCR: NEGATIVE

## 2021-10-22 LAB — CK: Total CK: 261 U/L (ref 49–397)

## 2021-10-22 MED ORDER — SODIUM CHLORIDE 0.9 % IV BOLUS
1000.0000 mL | Freq: Once | INTRAVENOUS | Status: AC
  Administered 2021-10-22: 1000 mL via INTRAVENOUS

## 2021-10-22 NOTE — ED Triage Notes (Signed)
Pt states dizziness and muscle fatigue x 2 days  Denies fever  Denies N/V

## 2021-10-22 NOTE — ED Provider Notes (Signed)
[facility] EMERGENCY DEPARTMENT Provider Note   CSN: [id number] Arrival date & time: 10/22/21  1804     History {Add pertinent medical, surgical, social history, OB history to HPI:1} Chief Complaint  Patient presents with   Dizziness    Kyle Martinez is a 51 y.o. male.   Dizziness      Home Medications Prior to Admission medications   Medication Sig Start Date End Date Taking? Authorizing Provider  acetaminophen (TYLENOL) 500 MG tablet Take 1,000 mg by mouth once.    [provider]  Calcium-Phosphorus-Vitamin D (CALCIUM GUMMIES PO) Take 3 tablets by mouth daily.      [provider]  DM-Phenylephrine-Acetaminophen (TYLENOL COLD MULTI-SYMPTOM) 10-5-325 MG/15ML LIQD Take 30 mLs by mouth once.      [provider]  HYDROcodone-acetaminophen (NORCO/VICODIN) 5-325 MG per tablet Take 1-2 tablets by mouth every 6 (six) hours as needed for moderate pain. 04/03/13   Vanetta Mulders, MD  ibuprofen (ADVIL,MOTRIN) 200 MG tablet Take 400 mg by mouth as needed. Pain     [provider]  losartan (COZAAR) 50 MG tablet Take 1 tablet (50 mg total) by mouth daily. 05/21/14   Rolland Porter, MD  LOSARTAN POTASSIUM-HCTZ PO Take by mouth.    [provider]  naproxen (NAPROSYN) 500 MG tablet Take 1 tablet (500 mg total) by mouth 2 (two) times daily. 04/03/13   Vanetta Mulders, MD  olmesartan-hydrochlorothiazide (BENICAR HCT) 40-25 MG per tablet Take 1 tablet by mouth daily.      [provider]  Oseltamivir Phosphate (TAMIFLU PO) Take by mouth.    [provider]  oxyCODONE-acetaminophen (PERCOCET) 5-325 MG per tablet Take 1-2 tablets by mouth every 6 (six) hours as needed for severe pain. 07/19/14   Hess, Nada Boozer, PA-C  potassium chloride (K-DUR) 10 MEQ tablet Take 1 tablet (10 mEq total) by mouth 2 (two) times daily. 05/21/14   Rolland Porter, MD  potassium chloride SA (K-DUR,KLOR-CON) 20 MEQ tablet Take 1 tablet (20 mEq total) by  mouth 2 (two) times daily. 05/26/15   Palumbo, April, MD  potassium chloride SA (K-DUR,KLOR-CON) 20 MEQ tablet Take 1 tablet (20 mEq total) by mouth 2 (two) times daily for 5 days. 03/13/17 03/18/17  Long, Arlyss Repress, MD  promethazine (PHENERGAN) 25 MG tablet Take 1 tablet (25 mg total) by mouth every 6 (six) hours as needed for nausea or vomiting. 04/03/13   Vanetta Mulders, MD  Pseudoephedrine-APAP-DM (TYLENOL COLD/FLU SEVERE DAY) 60-1000-30 MG/30ML LIQD Take 30 mLs by mouth every 4 (four) hours as needed. For congestion    [provider]  Rosuvastatin Calcium (CRESTOR PO) Take 1 tablet by mouth daily.      [provider]      Allergies    Patient has no known allergies.    Review of Systems   Review of Systems  Neurological:  Positive for dizziness.    Physical Exam Updated Vital Signs BP (!) 146/96   Pulse (!) 48   Temp 98.2 F (36.8 C) (Oral)   Resp 16   Ht 6' (1.829 m)   Wt 109.3 kg   SpO2 99%   BMI 32.68 kg/m  Physical Exam  ED Results / Procedures / Treatments   Labs (all labs ordered are listed, but only abnormal results are displayed) Labs Reviewed  BASIC METABOLIC PANEL - Abnormal; Notable for the following components:      Result Value   Chloride 97 (*)    Glucose, Bld 122 (*)  All other components within normal limits  SARS CORONAVIRUS 2 BY RT PCR  CBC  URINALYSIS, ROUTINE W REFLEX MICROSCOPIC  CK    EKG EKG Interpretation  Date/Time:  Tuesday October 22 2021 18:11:51 EDT Ventricular Rate:  56 PR Interval:  236 QRS Duration: 96 QT Interval:  422 QTC Calculation: 407 R Axis:   86 Text Interpretation: Sinus bradycardia with 1st degree A-V block Otherwise normal ECG When compared with ECG of 13-Mar-2017 07:11, No significant change since last tracing Confirmed by Meridee Score [id number]) on 10/22/2021 6:12:57 PM  Radiology No results found.  Procedures Procedures  {Document cardiac monitor, telemetry assessment procedure when  appropriate:1}  Medications Ordered in ED Medications  sodium chloride 0.9 % bolus 1,000 mL ( Intravenous Stopped 10/22/21 2128)    ED Course/ Medical Decision Making/ A&P                           Medical Decision Making Amount and/or Complexity of Data Reviewed Labs: ordered.   ***  {Document critical care time when appropriate:1} {Document review of labs and clinical decision tools ie heart score, Chads2Vasc2 etc:1}  {Document your independent review of radiology images, and any outside records:1} {Document your discussion with family members, caretakers, and with consultants:1} {Document social determinants of health affecting pt's care:1} {Document your decision making why or why not admission, treatments were needed:1} Final Clinical Impression(s) / ED Diagnoses Final diagnoses:  Dehydration  Lightheadedness    Rx / DC Orders ED Discharge Orders     None

## 2021-10-22 NOTE — Discharge Instructions (Signed)
Drink plenty of fluids  Make sure to rest  Follow up with primary care provider  Return for new or worsening symptoms
